# Patient Record
Sex: Female | Born: 1955 | Race: White | Hispanic: No | Marital: Married | Smoking: Never smoker
Health system: Southern US, Community
[De-identification: ages and names within clinical notes are randomized; demographics above are authoritative.]

## PROBLEM LIST (undated history)

## (undated) DIAGNOSIS — Z860101 Personal history of adenomatous and serrated colon polyps: Secondary | ICD-10-CM

## (undated) DIAGNOSIS — Z86010 Personal history of colonic polyps: Secondary | ICD-10-CM

## (undated) DIAGNOSIS — K219 Gastro-esophageal reflux disease without esophagitis: Secondary | ICD-10-CM

## (undated) DIAGNOSIS — R0789 Other chest pain: Secondary | ICD-10-CM

## (undated) DIAGNOSIS — F418 Other specified anxiety disorders: Secondary | ICD-10-CM

## (undated) DIAGNOSIS — E785 Hyperlipidemia, unspecified: Secondary | ICD-10-CM

## (undated) DIAGNOSIS — R002 Palpitations: Secondary | ICD-10-CM

## (undated) DIAGNOSIS — F410 Panic disorder [episodic paroxysmal anxiety] without agoraphobia: Secondary | ICD-10-CM

## (undated) HISTORY — DX: Panic disorder (episodic paroxysmal anxiety): F41.0

## (undated) HISTORY — PX: COLONOSCOPY W/ POLYPECTOMY: SHX1380

## (undated) HISTORY — DX: Other chest pain: R07.89

## (undated) HISTORY — DX: Palpitations: R00.2

## (undated) HISTORY — DX: Other specified anxiety disorders: F41.8

## (undated) HISTORY — DX: Personal history of adenomatous and serrated colon polyps: Z86.0101

## (undated) HISTORY — DX: Gastro-esophageal reflux disease without esophagitis: K21.9

## (undated) HISTORY — DX: Hyperlipidemia, unspecified: E78.5

## (undated) HISTORY — DX: Personal history of colonic polyps: Z86.010

## (undated) HISTORY — PX: CARDIOVASCULAR STRESS TEST: SHX262

---
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
## 1993-10-03 HISTORY — PX: ABDOMINAL HYSTERECTOMY: SHX81

## 1998-04-09 ENCOUNTER — Other Ambulatory Visit: Admission: RE | Admit: 1998-04-09 | Discharge: 1998-04-09 | Payer: Self-pay | Admitting: Urology

## 2000-10-04 ENCOUNTER — Other Ambulatory Visit: Admission: RE | Admit: 2000-10-04 | Discharge: 2000-10-04 | Payer: Self-pay | Admitting: Obstetrics and Gynecology

## 2001-04-16 ENCOUNTER — Ambulatory Visit (HOSPITAL_COMMUNITY): Admission: RE | Admit: 2001-04-16 | Discharge: 2001-04-16 | Payer: Self-pay | Admitting: Gastroenterology

## 2001-04-16 ENCOUNTER — Encounter (INDEPENDENT_AMBULATORY_CARE_PROVIDER_SITE_OTHER): Payer: Self-pay | Admitting: Specialist

## 2001-08-31 ENCOUNTER — Ambulatory Visit (HOSPITAL_COMMUNITY): Admission: RE | Admit: 2001-08-31 | Discharge: 2001-08-31 | Payer: Self-pay | Admitting: Orthopedic Surgery

## 2001-08-31 ENCOUNTER — Encounter: Payer: Self-pay | Admitting: Orthopedic Surgery

## 2001-10-24 ENCOUNTER — Other Ambulatory Visit: Admission: RE | Admit: 2001-10-24 | Discharge: 2001-10-24 | Payer: Self-pay | Admitting: Obstetrics and Gynecology

## 2003-01-16 ENCOUNTER — Other Ambulatory Visit: Admission: RE | Admit: 2003-01-16 | Discharge: 2003-01-16 | Payer: Self-pay | Admitting: Obstetrics and Gynecology

## 2004-02-02 ENCOUNTER — Encounter: Payer: Self-pay | Admitting: Family Medicine

## 2006-10-03 DIAGNOSIS — R002 Palpitations: Secondary | ICD-10-CM

## 2006-10-03 HISTORY — DX: Palpitations: R00.2

## 2007-05-29 ENCOUNTER — Encounter: Payer: Self-pay | Admitting: Family Medicine

## 2007-11-28 ENCOUNTER — Encounter: Payer: Self-pay | Admitting: Family Medicine

## 2007-12-06 ENCOUNTER — Encounter: Payer: Self-pay | Admitting: Family Medicine

## 2008-01-30 ENCOUNTER — Observation Stay (HOSPITAL_COMMUNITY): Admission: EM | Admit: 2008-01-30 | Discharge: 2008-01-30 | Payer: Self-pay | Admitting: Emergency Medicine

## 2008-01-30 ENCOUNTER — Encounter: Payer: Self-pay | Admitting: Family Medicine

## 2008-02-18 ENCOUNTER — Encounter: Admission: RE | Admit: 2008-02-18 | Discharge: 2008-02-18 | Payer: Self-pay | Admitting: Family Medicine

## 2008-02-18 ENCOUNTER — Encounter: Payer: Self-pay | Admitting: Family Medicine

## 2010-04-26 ENCOUNTER — Encounter: Payer: Self-pay | Admitting: Family Medicine

## 2010-05-20 ENCOUNTER — Encounter: Payer: Self-pay | Admitting: Family Medicine

## 2010-05-28 ENCOUNTER — Encounter: Payer: Self-pay | Admitting: Family Medicine

## 2010-09-29 ENCOUNTER — Ambulatory Visit
Admission: RE | Admit: 2010-09-29 | Discharge: 2010-09-29 | Payer: Self-pay | Source: Home / Self Care | Attending: Family Medicine | Admitting: Family Medicine

## 2010-09-29 ENCOUNTER — Encounter: Payer: Self-pay | Admitting: Family Medicine

## 2010-09-29 DIAGNOSIS — F329 Major depressive disorder, single episode, unspecified: Secondary | ICD-10-CM

## 2010-09-29 DIAGNOSIS — E785 Hyperlipidemia, unspecified: Secondary | ICD-10-CM | POA: Insufficient documentation

## 2010-09-29 DIAGNOSIS — F410 Panic disorder [episodic paroxysmal anxiety] without agoraphobia: Secondary | ICD-10-CM

## 2010-09-29 DIAGNOSIS — D126 Benign neoplasm of colon, unspecified: Secondary | ICD-10-CM | POA: Insufficient documentation

## 2010-09-29 DIAGNOSIS — R635 Abnormal weight gain: Secondary | ICD-10-CM | POA: Insufficient documentation

## 2010-09-29 HISTORY — DX: Panic disorder (episodic paroxysmal anxiety): F41.0

## 2010-10-11 ENCOUNTER — Encounter: Payer: Self-pay | Admitting: Family Medicine

## 2010-10-23 ENCOUNTER — Ambulatory Visit
Admission: RE | Admit: 2010-10-23 | Discharge: 2010-10-23 | Payer: Self-pay | Source: Home / Self Care | Attending: Internal Medicine | Admitting: Internal Medicine

## 2010-10-23 DIAGNOSIS — J309 Allergic rhinitis, unspecified: Secondary | ICD-10-CM | POA: Insufficient documentation

## 2010-10-23 DIAGNOSIS — J019 Acute sinusitis, unspecified: Secondary | ICD-10-CM | POA: Insufficient documentation

## 2010-11-04 NOTE — Assessment & Plan Note (Signed)
Summary: New pt - high cholestrol BCBS/dt   Vital Signs:  Patient profile:   55 year old female Menstrual status:  hysterectomy Height:      68.25 inches Weight:      164 pounds BMI:     24.84 O2 Sat:      97 % on Room air Temp:     97.4 degrees F oral Pulse rate:   60 / minute Pulse rhythm:   regular BP sitting:   110 / 74  (right arm) Cuff size:   regular  Vitals Entered By: Francee Piccolo CMA Duncan Dull) (September 29, 2010 8:38 AM)  O2 Flow:  Room air  History of Present Illness: 55 y/o WF here to establish care, transferred from [facility] in [place] b/c it became too busy, "always waited too long for appt". Her concern is her weight--exercises regulary and eats prudent diet and has had gain of 20+ lbs over last few years.  Had lab panel (TSH, CBC, CMET, and Lipids) 05/2010 which was all normal except LDL 167 (Tchol 254).  Her HDL was 71 and her Trig were 82.  Her MD advised her to do basic TLC's. She has maintained healthy exercise habits all her life: cardio almost daily=45 min exercise bike, lifts wts several days per week, walks 4 mi with dog 3-4 days per week. Dietary habits good: wt watchers in the past, pays attention to portion size and tries to avoid high fat/high carb foods, tries to eat lots of veggies/fruits, avoids fast food and high fructose corn syrup altogether.  Admits that having a sweet tooth is her weakness but seems to control this well.  Other PMH details: long hx of intermittent mild depression, for which lexapro 5mg  helps. Several years of panic attacks that occur sporadically, seemingly unprovoked, characterized by lightheaded feeling, warmth/flushing, rapid heartbeat, feeling of doom...Marland KitchenMarland KitchenMarland Kitchenhas had cardiac eval in the past for this and all was negative.  She has learned to control these rather well with breathing exercises. She underwent hysterectomy at about age 48 and remembers having perimenopausal syndrome for a few years in her early 40s---these have  essentially resolved now.  She gets regular pelvic exams and mammograms  through her GYN.  Currently she is finishing up a course of zithromax for a sinus infection---says sinus symptoms have now abated.  Preventive Screening-Counseling & Management  Alcohol-Tobacco     Alcohol drinks/day: 0     Smoking Status: never  Caffeine-Diet-Exercise     Does Patient Exercise: yes      Drug Use:  no.    Current Medications (verified): 1)  Zithromax Z-Pak 250 Mg Tabs (Azithromycin) .... As Directed 2)  Lexapro 5 Mg Tabs (Escitalopram Oxalate) .... Take 1 Tablet By Mouth Once A Day 3)  Gnc Women's Heart Pack .... Take 1 Pack By Mouth Daily  Allergies (verified): 1)  Amoxicillin 2)  Erythromycin  Past History:  Family History: Last updated: 09/29/2010 Father: CAD in 45s, ?Lung cancer, DM 2. Mother: age 85--Healthy! Older brother: MI in 64's, hyperlipidemia. Older sister: healthy. 2nd brother d. age 73--accidental.  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 Mother: age 56--Healthy! Older brother: MI in 32's, hyperlipidemia. Older sister: healthy. 2nd brother d. age 57--accidental.  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 Smoked Alcohol use-no Drug use-no Regular exercise-yes Occupation:  employed Smoking Status:  never Drug Use:  no Does Patient Exercise:  yes  Review of Systems  The patient denies  anorexia, fever, weight loss, weight gain, vision loss, decreased hearing, hoarseness, chest pain, syncope, dyspnea on exertion, peripheral edema, prolonged cough, headaches, hemoptysis, abdominal pain, melena, hematochezia, severe indigestion/heartburn, hematuria, incontinence, genital sores, muscle weakness, suspicious skin lesions, transient blindness, difficulty walking, depression, unusual weight change, abnormal bleeding, enlarged lymph nodes, angioedema, and breast masses.    Physical Exam  General:  VS: noted, all normal.  BMI 24 Gen: Alert, well appearing, oriented x 4.  Pleasant affect. HEENT: Scalp without lesions or hair loss.  Ears: EACs clear, normal epithelium.  TMs with good light reflex and landmarks bilaterally.  Eyes: no injection, icteris, swelling, or exudate.  EOMI, PERRLA. Nose: no drainage or turbinate edema/swelling.  No inection or focal lesion.  Mouth: lips without lesion/swelling.  Oral mucosa pink and moist.  Dentition intact and without obvious caries or gingival swelling.  Oropharynx without erythema, exudate, or swelling.  Neck: supple.  No lymphadenopathy, thyromegaly, or mass. Chest: symmetric expansion, with nonlabored respirations.  Clear and equal breath sounds in all lung fields.   CV: RRR, no m/r/g.  Peripheral pulses 2+/symmetric. ABD: soft, NT, ND, BS normal.  No hepatospenomegaly or mass.  No bruits. EXT: no clubbing, cyanosis, or edema.     Impression & Recommendations:  Problem # 1:  WEIGHT GAIN (ICD-783.1) Assessment New Likely secondary to metabolic changes associated with aging and postmenopausal state. Reassured pt and encouraged her to continue healthy exercise habits and diet.  Also, recommended nutritionist consult, so she will pursue this on her own and call if referral from me is needed.  Problem # 2:  HYPERLIPIDEMIA, WITH HIGH HDL (ICD-272.4) Assessment: New Nutritionist referral as noted above. Recheck fasting lipids and serum glucose  in the spring 2012.  Problem # 3:  DEPRESSION (ICD-311) Assessment: Unchanged Doing well on lexapro 5mg  once daily.  Her updated medication list for this problem includes:    Lexapro 5 Mg Tabs (Escitalopram oxalate) .Marland Kitchen... Take 1 tablet by mouth once a day  Problem # 4:  COLONIC POLYPS, ADENOMATOUS (ICD-211.3) Assessment: Unchanged She is followed by GI (Dr. Kinnie Scales?) appropriately and will continue this. Will obtain old records.  Complete Medication List: 1)  Zithromax Z-pak 250 Mg Tabs (Azithromycin) .... As directed 2)  Lexapro 5 Mg Tabs (Escitalopram oxalate) .... Take 1 tablet by mouth once a day 3)  Gnc Women's Heart Pack  .... Take 1 pack by mouth daily  Patient Instructions: 1)  Arrange f/u this spring at your convenience. 2)  Arrange fasting blood work to be done 1 wk prior to that visit (lipid panel, fasting glucose).   Orders Added: 1)  New Patient Level III [21308]

## 2010-11-04 NOTE — Procedures (Signed)
Summary: Colon/Jeffrey Medoff MD  Colon/Jeffrey Medoff MD   Imported By: Lester [place] 10/13/2010 09:39:50  _____________________________________________________________________  External Attachment:    Type:   Image     Comment:   External Document

## 2010-11-04 NOTE — Procedures (Signed)
Summary: Colon/Jeffrey Medoff MD  Colon/Jeffrey Medoff MD   Imported By: Lester [place] 10/13/2010 09:42:26  _____________________________________________________________________  External Attachment:    Type:   Image     Comment:   External Document

## 2010-11-04 NOTE — Miscellaneous (Signed)
  Clinical Lists Changes  Observations: Added new observation of FAMILY HX: Father: CAD in 75s, ?Lung cancer, DM 2, depression. Mother: age 55--Healthy! Older brother: MI in 66's, hyperlipidemia. Older sister: healthy. 2nd brother d. age 9--accidental. (10/11/2010 15:08) Added new observation of PAST MED HX: Depression Panic disorder Hyperlipidemia (LDL 167; goal<160) Adenomatous colon polyps 2002, TCS nl 2005 and 2009 (except oozing internal hemorrhoid tx'd with sclerotherapy 2009)--Dr. Medoff Palpitations-Holter neg 2008 Cardiolite NEG 2004 (10/11/2010 15:08) Added new observation of PRIMARY MD: Nicoletta Ba, MD (10/11/2010 15:08) Added new observation of MAMMOGRAM: normal (04/26/2010 15:13) Added new observation of COLONOSCOPY: normal (12/06/2007 15:13) Added new observation of TD BOOSTER: Tdap (01/24/2006 15:13)       Family History: Father: CAD in 62s, ?Lung cancer, DM 2, depression. Mother: age 55--Healthy! Older brother: MI in 20's, hyperlipidemia. Older sister: healthy. 2nd brother d. age 9--accidental.    Past History:  Past Medical History: Depression Panic disorder Hyperlipidemia (LDL 167; goal<160) Adenomatous colon polyps 2002, TCS nl 2005 and 2009 (except oozing internal hemorrhoid tx'd with sclerotherapy 2009)--Dr. Medoff Palpitations-Holter neg 2008 Cardiolite NEG 2004   Preventive Care Screening  Mammogram:    Date:  04/26/2010    Results:  normal  Colonoscopy:    Date:  12/06/2007    Results:  normal  Last Tetanus Booster:    Date:  01/24/2006    Results:  Tdap

## 2010-11-04 NOTE — Letter (Signed)
Summary: [facility]  [facility]   Imported By: Lester [place] 10/13/2010 09:41:07  _____________________________________________________________________  External Attachment:    Type:   Image     Comment:   External Document

## 2010-11-04 NOTE — Assessment & Plan Note (Signed)
Summary: SORE THROAT/PN   Vital Signs:  Patient profile:   55 year old female Menstrual status:  hysterectomy Weight:      169 pounds BMI:     25.60 Temp:     98.5 degrees F Pulse rate:   52 / minute BP sitting:   108 / 70  Vitals Entered By: Lamar Sprinkles, CMA (October 23, 2010 10:19 AM) CC: sore throat/SD   History of Present Illness: Tanya Daniels comes in today  for "  bad raw sore throat "  to [facility]   onset yestrerday  and  going out of town for a  week.      gets them recurrent .    and couple times a year  consider / sinsutisi  hving pnd . no fever  hurts to swallow.   No cough congestion but nasal drip some.    took mucinex.   Saw Dr Foy Guadalajara her previous pcp in the past   and rx with z pack  and flonase   and another nose spray.   / sinus infection.  "if not treated   gets lung issues.   going tpo chest : no strep exposures .     has allergic rhnitis ususally fall   ,, also mold and dust.   no obv recent flares. used to see allergist Dr Nira Retort . No tobacco or ets.    Preventive Screening-Counseling & Management  Alcohol-Tobacco     Alcohol drinks/day: 0     Smoking Status: never  Current Medications (verified): 1)  Lexapro 10 Mg Tabs (Escitalopram Oxalate) .Marland Kitchen.. 1 Once Daily 2)  Gnc Women's Heart Pack .... Take 1 Pack By Mouth Daily  Allergies (verified): 1)  Amoxicillin 2)  Erythromycin  Past History:  Past Medical History: Depression Panic disorder Hyperlipidemia (LDL 167; goal<160) Adenomatous colon polyps 2002, TCS nl 2005 and 2009 (except oozing internal hemorrhoid tx'd with sclerotherapy 2009)--Dr. Medoff Palpitations-Holter neg 2008 Cardiolite NEG 2004 Allergic rhinitis saw Dr Corinda Gubler in the past.  Family History: Father: CAD in 62s, ?Lung cancer, DM 2, depression. Mother: age 96--Healthy! Older brother: MI in 16's, hyperlipidemia. Older sister: healthy. 2nd brother d. age 78--accidental.      Social History: Reviewed history  from 09/29/2010 and no changes required. 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         see hpi  Physical Exam  General:  Well-developed,well-nourished,in no acute distress; alert,appropriate and cooperative throughout examination non toxic but doesnt feel well.  Head:  normocephalic and atraumatic.   Eyes:  vision grossly intact, pupils equal, and pupils round.   Ears:  R ear normal, L ear normal, and no external deformities.   Nose:  no external deformity and no external erythema.  congested   sightly tender left max sinus Mouth:  very red  post OP  crescentic area no lesions no ulcers  Neck:  tedner ac nodes neg pc nodes  Lungs:  normal respiratory effort, no intercostal retractions, and no accessory muscle use.   Heart:  normal rate, regular rhythm, and no murmur.   Pulses:  nl cap refill  Neurologic:  non focal Skin:  turgor normal and color normal.   Cervical Nodes:  no posterior cervical adenopathy.  tender ac area  Psych:  Oriented X3, normally interactive, and not anxious appearing.     Impression & Recommendations:  Problem # 1:  SINUSITIS - ACUTE-NOS (ICD-461.9) poss resolve on own but  leaving town   and patient concerned about this    reviewed Treatment options discussed.      hx of gi se of some other meds .  Her updated medication list for this problem includes:    Azithromycin 250 Mg Tabs (Azithromycin) .Marland Kitchen... Take 2 by mouth day 1 then 1 by mouth once daily for 4 more days    Fluticasone Propionate 50 Mcg/act Susp (Fluticasone propionate) .Marland Kitchen... 2 spray s each nostril q d  Problem # 2:  PHARYNGITIS-ACUTE (ICD-462) prob related  to pnd of above  Her updated medication list for  this problem includes:    Azithromycin 250 Mg Tabs (Azithromycin) .Marland Kitchen... Take 2 by mouth day 1 then 1 by mouth once daily for 4 more days  Problem # 3:  ALLERGIC RHINITIS (ICD-477.9) fall and  molds and dust .  reviewed   poss aggravators  if recurrent issue Her updated medication list for this problem includes:    Fluticasone Propionate 50 Mcg/act Susp (Fluticasone propionate) .Marland Kitchen... 2 spray s each nostril q d  Complete Medication List: 1)  Lexapro 10 Mg Tabs (Escitalopram oxalate) .Marland Kitchen.. 1 once daily 2)  Gnc Women's Heart Pack  .... Take 1 pack by mouth daily 3)  Azithromycin 250 Mg Tabs (Azithromycin) .... Take 2 by mouth day 1 then 1 by mouth once daily for 4 more days 4)  Fluticasone Propionate 50 Mcg/act Susp (Fluticasone propionate) .... 2 spray s each nostril q d  Patient Instructions: 1)  begin flonase   2)  can begin antibioitc but many sinus infections resolve in 5- 10 days without  antibioitcs.   3)  azithro may not always be the best choice for sinsutis . 4)  follow up with dr Milinda Cave if recurrent problems.  5)  Acute sinusitis symptoms for less than 10 days are not helped by antibiotics. Use warm moist compresses, and over the counter decongestants( only as directed). Call if no improvement in 5-7 days, sooner if increasing pain, fever, or new symptoms.  Prescriptions: FLUTICASONE PROPIONATE 50 MCG/ACT SUSP (FLUTICASONE PROPIONATE) 2 spray s each nostril q d  #1 x 3   Entered and Authorized by:   Madelin Headings MD   Signed by:   Madelin Headings MD on 10/23/2010   Method used:   Electronically to        [id number] (retail)       [address] [place], [place]  [address]       Ph: [phone] or [phone]       Fax: [phone]   RxID:   [id number] AZITHROMYCIN 250 MG TABS (AZITHROMYCIN) take 2 by mouth day 1 then 1 by mouth once daily for 4 more days  #6 x 0   Entered and Authorized by:   Madelin Headings MD   Signed by:   Madelin Headings MD on  10/23/2010   Method used:   Electronically to        [facility] [facility] [phone]* (retail)       [address]       [place], [place]  [address]  Ph: [phone] or [phone]       Fax: [phone]   RxID:   [id number]    Orders Added: 1)  Est. Patient Level IV [44010]

## 2010-11-04 NOTE — Miscellaneous (Signed)
  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 Kinnie Scales (09/29/2010 10:28) Added new observation of GYNECO MD: Dr. Senaida Ores (09/29/2010 10:28) Added new observation of PRIMARY MD: Nicoletta Ba, MD (09/29/2010 10:28)      Care Coordination Cardiologist: [facility] cardiology Gastroenterologist: Dr. Kinnie Scales Gynecologist: Dr. 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 Never Smoked Alcohol use-no Drug use-no Regular exercise-yes

## 2010-12-09 ENCOUNTER — Encounter: Payer: Self-pay | Admitting: Family Medicine

## 2011-02-15 NOTE — Consult Note (Signed)
Tanya Daniels, SEABORN NO.:  [id number]   MEDICAL RECORD NO.:  [id number]          PATIENT TYPE:  INP   LOCATION:  4729                         FACILITY:  [facility]   PHYSICIAN:  Francisca December, M.D.  DATE OF BIRTH:  26-Jun-1956   DATE OF CONSULTATION:  01/30/2008  DATE OF DISCHARGE:                                 CONSULTATION   CHIEF COMPLAINT:  Chest pain.   Ms. Tanya Daniels is a 55 year old female with no known history of coronary  artery disease who states she had a panic attack yesterday afternoon.  She complained of chest wall burning and tingling and actually called  EMS initially yesterday afternoon.  No transport.  Then later yesterday  evening, the same symptoms occurred but worsened.  She had no shortness  of breath, palpitation, dizziness or syncope.  Of note, she does  exercise about a hour daily.   She states she had a Cardiolite about five years ago under the care of  Dr. Corliss Marcus.  She reports this as normal.  She had a 2-D echo in  the office as well and has had a heart monitor before when she had felt  palpitations during panic attacks but she states that this test never  showed anything.  I am trying to retrieve these records now.   SOCIAL HISTORY:  No tobacco, rare alcohol, no illicit drug use.   FAMILY HISTORY:  Dad had a heart attack around the age of 55.  Mom alive  at 34.   ALLERGIES:  No known drug allergies.   MEDICATIONS:  1. Enteric coated aspirin 325 mg a day.  2. Wellbutrin 150 mg a day.  3. Lexapro 10 mg a day.  4. Protonix 40 mg a day.   PAST MEDICAL HISTORY:  1. Depression.  2. Anxiety.  3. Panic attacks.  4. Status post partial hysterectomy.   PHYSICAL EXAMINATION:  VITAL SIGNS:  Temperature 98.2, pulse 67,  respirations 24, blood pressure 102/63, O2 saturation 97% on room air.  HEENT:  Grossly normal.  No carotid or subclavian bruits.  No JVD or  thyromegaly.  Sclerae clear.  Conjunctivae normal.  Nares without  drainage.  CHEST:  Clear to auscultation bilaterally.  No wheezing or rhonchi.  HEART:  Regular rate and rhythm.  No gross murmur, rub or ectopy.  ABDOMEN:  Good bowel sounds, nontender, nondistended, no mass, no  bruits.  LOWER EXTREMITIES:  No peripheral edema.  Palpable lower extremity  pulses.  SKIN:  Warm and dry.  NEURO:  Cranial nerves II-XII grossly intact.  Normal mood and affect.   REVIEW OF SYSTEMS:  As above, otherwise negative.   LABORATORY STUDIES:  Point of care markers and cardiac isoenzymes  essentially normal.  BMP 34.  Total cholesterol 213, triglycerides 25,  LDL 152, HDL 56.  Lipase 30.  D-dimer less than 0.22.  Sodium 140,  potassium 3.8, BUN 13, creatinine 0.97.  LFTs normal.   EKG shows normal sinus rhythm, no acute ST-T wave changes.   Chest x-ray no active disease.   ASSESSMENT/PLAN:  1. Chest pain, resolved.  2.  Anxiety/depression.  3. Hyperlipidemia.   RECOMMENDATIONS:  Diet with exercise and may ultimately need to go ahead  and start a Statin.   Plan to have the patient come in for stress Cardiolite on Monday, Feb 04, 2008, at 8:30 a.m.  We will send the patient home with some  sublingual nitroglycerin and Xanax 0.25 mg one p.o. q.8h. p.r.n., #10  with no refills.  The patient is to call Dr. Amil Amen for any further  problems.  The patient may be discharged from a cardiovascular  standpoint.      Guy Franco, P.A.      Francisca December, M.D.  Electronically Signed    LB/MEDQ  D:  01/30/2008  T:  01/30/2008  Job:  [id number]   cc:   Molly Maduro L. Foy Guadalajara, M.D.

## 2011-02-15 NOTE — H&P (Signed)
Tanya Daniels, Tanya Daniels NO.:  [id number]   MEDICAL RECORD NO.:  [id number]          PATIENT TYPE:  EMS   LOCATION:  [facility]JO                         FACILITY:  [facility]   PHYSICIAN:  Michiel Cowboy, MDDATE OF BIRTH:  20-Sep-1956   DATE OF ADMISSION:  01/30/2008  DATE OF DISCHARGE:                              HISTORY & PHYSICAL   PRIMARY CARE PHYSICIAN:  Robert L. Foy Guadalajara, M.D.   CHIEF COMPLAINT:  Chest pain.   HISTORY OF PRESENT ILLNESS:  The patient is a 55 year old female with  history of hyperlipidemia, though not on any medications.  There is also  history of anxiety.  Presents with atypical chest pain.  She was at her  baseline of health until yesterday when she had an episode of  palpitations for which EMS was called, and they warned this was thought  to be secondary to an anxiety attack.  Today, this morning, she woke up  with substernal chest pain and radiation to both arms and to the back,  burning-like in one spot, coming in waves lasting 30 seconds at a time  and then going away.   Currently, during this discussion, she only states she has some warm  sensation in her chest.  She never had this kind of similar episodes  before.  She does not smoke or drink.   REVIEW OF SYSTEMS:  Unremarkable.  Negative for shortness of breath,  fevers, chills, nausea, vomiting.  Positive for diaphoresis.   FAMILY HISTORY:  Significant for grandfather with heart attack between  50-60s.   SOCIAL HISTORY:  Denies alcohol or drug abuse.  Does not smoke.  Drinks  only occasionally.   PAST MEDICAL HISTORY:  Significant for anxiety, question hyperlipidemia.   MEDICATIONS:  1. Lexapro 20 mg p.o. daily, today is the first dose.  2. Albuterol 150 mg p.o. daily.   ALLERGIES:  No known drug allergies.   PHYSICAL EXAMINATION:  VITAL SIGNS:  Temperature 97.3, blood pressure  109/69, pulse 72, respirations 20, saturating 100% on room air.  GENERAL:  The patient appears to be  female in no acute distress.  HEENT:  Normocephalic.  Mucous membranes moist.  No lymphadenopathy  noted.  LUNGS:  Clear to auscultation bilaterally.  HEART:  Regular rate and rhythm.  No murmurs, rubs or gallops.  ABDOMEN:  Soft, nontender, nondistended.  NEUROLOGICAL:  Intact.  EXTREMITIES:  Lower extremities without edema.   LABORATORY DATA:  White blood cell count 6.0, hemoglobin 12.0, platelets  217.  INR 0.9, Sodium 140, potassium 3.8, creatinine 0.97.  LFTs are  within normal limits.  Lipase 30.  CK MB 1.4.  Troponin was initially  elevated at 0.06 with repeat less than 0.05.  EKG showing no evidence of  ischemia or infarction.  D. dimer negative.  Chest x-ray not obtained.   ASSESSMENT:  1. This is a 55 year old female with very difficult chest pain.      Etiology not quite sure.  Troponin is slightly elevated.  The      patient initially was started on heparin and nitroglycerin drip,      but  currently is slightly hypertensive and chest pain is really of      just a warm sensation.  Will stop both for now since today the ED      physicians have been having a few falsely positive elevated      troponins with I markers.  Will repeat cardiac enzymes and cycle      q.8.  Will risk stratify the fasting lipid panel, morning EKG.      Will obtain chest x-ray.  D. dimer is negative.  Other      differentials include GI etiology such as esophageal spasm versus      heartburn.  Will do Protonix 40 mg p.o. daily and Carafate.  Would      recommend discussing her with Cardiology to see if they want to go      ahead and get a stress test done while inpatient versus as an      outpatient.  This is not a typical chest pain for aortic      dissection.  Will obtain chest x-ray and measure blood pressure in      both arms, again, if the pain is radiating to the back.  The      patient appears to be very stable.  Will hold off on ordering CT of      her chest for now.  2. Palpitations.  Put  on telemetry.  3. History of anxiety.  Will continue home medications.  4. Prophylaxis SCDs and Protonix.      Michiel Cowboy, MD  Electronically Signed     AVD/MEDQ  D:  01/30/2008  T:  01/30/2008  Job:  [id number]   cc:   Molly Maduro L. Foy Guadalajara, M.D.

## 2011-02-18 NOTE — Op Note (Signed)
[facility]  Patient:    Tanya Daniels, Tanya Daniels Visit Number: [id number] MRN: [id number]          Service Type: DSU Location: DAY Attending Physician:  Marlowe Kays Page Dictated by:   Illene Labrador. Aplington, M.D. Proc. Date: 08/31/01 Admit Date:  08/31/2001                             Operative Report  PREOPERATIVE DIAGNOSIS:  Painful bunion, right foot.  POSTOPERATIVE DIAGNOSIS:  Painful bunion, right foot.  OPERATION:  Simple bunionectomy, right foot.  SURGEON:  Illene Labrador. Aplington, M.D.  ASSISTANT:  Nurse.  ANESTHESIA:  General.  PATHOLOGY AND JUSTIFICATION FOR PROCEDURE:  She had a prominent tender bunion with problems with shoe wearing, good-looking MP joint on plain x-rays with standing film demonstrating an 11 degree first second metatarsal angle, so I felt a simple bunionectomy would take care of the problem.  At surgery, she did have some early erosive changes on the first metatarsal head.  DESCRIPTION OF PROCEDURE:  Satisfactory general anesthesia, pneumatic tourniquet, foot and ankle prepped with DuraPrep and draped in a sterile field.  Dorsomedial incision from the distal first metatarsal down over the proximal phalanx of the great toe.  Incision was carried down through the capsule in line with the skin incision.  She had extensive adhesions between the capsule and the first metatarsal head.  After identifying the demarcation between bunion and apparent first metatarsal head, I made a countercut at the base of the bunion with a half-inch curved osteotome and then made a retrograde cut, removing most of the bunion, and I then trimmed up the remainder with small rongeur until I felt that we had removed all bunion bone and had nice cosmetic result.  The wound was then irrigated with sterile saline, and the wound toe blocked with 0.5% plain Marcaine with the toe in a corrected position.  I then closed the capsule with oblique  sutures, stabilizing the great toe in a corrected position.  Subcutaneous tissue and skin were then closed as a unit with interrupted 4-0 nylon mattress sutures. Betadine and Adaptic dry sterile dressing were applied.  Tourniquet was released.  She tolerated the procedure well and was taken to the recovery room in satisfactory condition with no known complications. Dictated by:   Illene Labrador. Aplington, M.D. Attending Physician:  Joaquin Courts DD:  08/31/01 TD:  08/31/01 Job: [id number]

## 2011-03-18 ENCOUNTER — Telehealth: Payer: Self-pay | Admitting: Family Medicine

## 2011-03-18 NOTE — Telephone Encounter (Signed)
Pt notified of recommendations below.  Pt wrote instructions down and was able to recite.  She is agreeable with plan.

## 2011-03-18 NOTE — Telephone Encounter (Signed)
Patient is welcome to be seen but there are some things she can try to manage the pain. For tension and/or Migraine HA I recommend Advil/Ibuprofen 400-600 mg every 6 hours with food, can alternate with Tylenol EX/Acetminophen 500 mg 2 tabs po every 6 hours to manage pain. Since she is having neck trouble too also recommend moist heat to neck for 15 minutes twice a day and as needed. In general to manage HA need 8 hours sleep, regular exercise, small frequent meals with lean proteins and complex carbs, cannot skip meals or eat too many carbs at once or this can trigger HA. Also need 64+oz of fluids daily. If no improvement call for further evaluation. If symptoms worsen over the weekend or neurologic concerns such as visual changes/numbness/weakness etc occur seek immediate medical care

## 2011-03-18 NOTE — Telephone Encounter (Signed)
I spoke with pt who states she is having a sharp, stabbing pain behind her right ear.  The pain began on 6/13.  The patient does admit that she has had these twinges, but not the stabbing pain in the last two years.  The patient has not had any lifestyle changes-sleep pattern changes or diet changes.  The pt is under increased stress at this time due to work.  The pain is interrupting her sleep.  She states the pain feels like an electrical shock at times.  Pt denies any photo/phonophobia, vision/gait disturbance.  Pt has taken five 200 mg advil in the last 24 hours and has applied ice pack to neck.  Pt has history of neck pain did go to chiropractor on 03/17/11.  Please advise.

## 2011-04-01 ENCOUNTER — Other Ambulatory Visit: Payer: Self-pay | Admitting: Family Medicine

## 2011-04-01 MED ORDER — ESCITALOPRAM OXALATE 5 MG PO TABS: 5.0000 mg | ORAL_TABLET | Freq: Every day | ORAL | Status: DC

## 2011-04-01 NOTE — Telephone Encounter (Signed)
Patient is wanting a refill on her lexipro. Patient is asking for a prescription for a year and for her to receive them 90 days at a time.

## 2011-04-01 NOTE — Telephone Encounter (Signed)
I have attempted to contact this patient by phone with the following results: message left to return my call with female at home number.

## 2011-04-01 NOTE — Telephone Encounter (Signed)
I'll do the 90d supply, RF x 4. I want her to come in for fasting lipid profile sometime this summer or fall.  She has a history of high cholesterol that she needs rechecked.  Doesn't need office visit for this unless the wants to.  Next office visit needs to be sometime around 11/2011.  Thanks--PM

## 2011-04-04 MED ORDER — ESCITALOPRAM OXALATE 5 MG PO TABS: 5.0000 mg | ORAL_TABLET | Freq: Every day | ORAL | Status: DC

## 2011-04-04 NOTE — Telephone Encounter (Signed)
Pt notified RX ready to pick up-she will have her son, [name], pick up.  Pt will need a 10 day RX to last until mail order arrives.  Advised about lipid panel and she is agreeable.  Pt will come in the fall for that as she wants more time to adjust diet and exercise and to lose more weight.

## 2011-06-28 LAB — CBC
Hemoglobin: 12
MCHC: 33.8
RBC: 4.18
WBC: 6

## 2011-06-28 LAB — HEMOGLOBIN A1C: Hgb A1c MFr Bld: 5

## 2011-06-28 LAB — COMPREHENSIVE METABOLIC PANEL
ALT: 17
Alkaline Phosphatase: 91
CO2: 25
Calcium: 9.1
Chloride: 106
GFR calc non Af Amer: >60
Glucose, Bld: 103 — ABNORMAL HIGH
Sodium: 140
Total Bilirubin: 0.4

## 2011-06-28 LAB — CK TOTAL AND CKMB (NOT AT ARMC)
CK, MB: 2.8
Relative Index: 2.1

## 2011-06-28 LAB — DIFFERENTIAL
Basophils Absolute: 0
Basophils Relative: 1
Eosinophils Absolute: 0.2
Eosinophils Relative: 3
Lymphs Abs: 1.8
Neutrophils Relative %: 58

## 2011-06-28 LAB — POCT CARDIAC MARKERS
CKMB, poc: 1.3
CKMB, poc: 1.4
Myoglobin, poc: 61.5
Operator id: 272551

## 2011-06-28 LAB — TROPONIN I: Troponin I: 0.01

## 2011-06-28 LAB — D-DIMER, QUANTITATIVE: D-Dimer, Quant: <0.22

## 2011-06-28 LAB — LIPID PANEL
Triglycerides: 25
VLDL: 5

## 2011-06-28 LAB — LIPASE, BLOOD: Lipase: 30

## 2011-06-28 LAB — PROTIME-INR: INR: 0.9

## 2011-06-28 LAB — CARDIAC PANEL(CRET KIN+CKTOT+MB+TROPI): CK, MB: 2.1

## 2011-06-28 LAB — B-NATRIURETIC PEPTIDE (CONVERTED LAB): Pro B Natriuretic peptide (BNP): 34

## 2011-08-22 ENCOUNTER — Encounter: Payer: Self-pay | Admitting: Family Medicine

## 2011-08-22 ENCOUNTER — Ambulatory Visit (INDEPENDENT_AMBULATORY_CARE_PROVIDER_SITE_OTHER): Payer: Self-pay | Admitting: Family Medicine

## 2011-08-22 DIAGNOSIS — E785 Hyperlipidemia, unspecified: Secondary | ICD-10-CM

## 2011-08-22 DIAGNOSIS — Z23 Encounter for immunization: Secondary | ICD-10-CM

## 2011-08-22 LAB — COMPREHENSIVE METABOLIC PANEL
Albumin: 4.1 g/dL (ref 3.5–5.2)
CO2: 26 mEq/L (ref 19–32)
Chloride: 107 mEq/L (ref 96–112)
GFR: 72.81 mL/min (ref >60.00)
Glucose, Bld: 97 mg/dL (ref 70–99)
Potassium: 4.3 mEq/L (ref 3.5–5.1)
Sodium: 141 mEq/L (ref 135–145)
Total Protein: 7.1 g/dL (ref 6.0–8.3)

## 2011-08-22 LAB — CBC WITH DIFFERENTIAL/PLATELET
Basophils Absolute: 0 10*3/uL (ref 0.0–0.1)
Eosinophils Absolute: 0.1 10*3/uL (ref 0.0–0.7)
HCT: 36.1 % (ref 36.0–46.0)
Hemoglobin: 12.3 g/dL (ref 12.0–15.0)
Lymphocytes Relative: 27 % (ref 12.0–46.0)
Lymphs Abs: 1.3 10*3/uL (ref 0.7–4.0)
MCHC: 34.1 g/dL (ref 30.0–36.0)
Neutro Abs: 3 10*3/uL (ref 1.4–7.7)
RDW: 13 % (ref 11.5–14.6)

## 2011-08-22 LAB — LDL CHOLESTEROL, DIRECT: Direct LDL: 161 mg/dL

## 2011-08-22 MED ORDER — ZOSTER VACCINE LIVE 19400 UNT/0.65ML ~~LOC~~ SOLR: 0.6500 mL | Freq: Once | SUBCUTANEOUS | Status: AC

## 2011-08-22 NOTE — Progress Notes (Signed)
OFFICE NOTE  08/22/2011  CC:  Chief Complaint  Patient presents with  . Depression     HPI: Patient is a 55 y.o. Caucasian female who is here for f/u panic/anxiety, lexapro use. Feels like things are going well, no side effects, wants to continue same dosing. She works out most days of the week, runs and does [employer], working a lot on diet/fitness. She asks for rx for zostavax today.  Pertinent PMH:  Anxiety/panic disorder/depression Adenomatous colon polyps Wt gain Family History  Problem Relation Age of Onset  . Coronary artery disease Father 10  . Cancer Father     Possible lung cancer  . Diabetes Father     type 2  . Depression Father   . Heart attack Brother 60  . Hyperlipidemia Brother    Past medical, surgical, and social history reviewed and there are no changes since their last office visit.  Pertinent Meds:  PE: Blood pressure 96/66, pulse 57, height 5' 8.25" (1.734 m), weight 170 lb (77.111 kg). Gen: Alert, well appearing.  Patient is oriented to person, place, time, and situation. CV: RRR, no m/r/g.   LUNGS: CTA bilat, nonlabored resps, good aeration in all lung fields.   IMPRESSION AND PLAN: Panic disorder, anxiety, depression: all stable.  Doing well on lexapro 5mg  qd and we'll continue this. Will do some screening labs today; pt has history of borderline cholesterol + FH hyperlip+CAD. CMET, CBC, FLP, TSH today. Her wt is stable at 169-170 lbs. Encouraged her and complemented her for doing such a great job with lifestyle modification.  FOLLOW UP: 1 yr.

## 2011-08-29 ENCOUNTER — Other Ambulatory Visit: Payer: BC Managed Care – PPO

## 2011-09-09 ENCOUNTER — Ambulatory Visit (INDEPENDENT_AMBULATORY_CARE_PROVIDER_SITE_OTHER): Payer: BC Managed Care – PPO | Admitting: Family Medicine

## 2011-09-09 ENCOUNTER — Encounter: Payer: Self-pay | Admitting: Family Medicine

## 2011-09-09 VITALS — BP 102/71 | HR 64 | Temp 98.2°F | Ht 68.25 in | Wt 176.0 lb

## 2011-09-09 DIAGNOSIS — R059 Cough, unspecified: Secondary | ICD-10-CM

## 2011-09-09 DIAGNOSIS — J3489 Other specified disorders of nose and nasal sinuses: Secondary | ICD-10-CM

## 2011-09-09 DIAGNOSIS — J019 Acute sinusitis, unspecified: Secondary | ICD-10-CM

## 2011-09-09 DIAGNOSIS — R05 Cough: Secondary | ICD-10-CM

## 2011-09-09 MED ORDER — CEPHALEXIN 500 MG PO CAPS: ORAL_CAPSULE | ORAL | Status: DC

## 2011-09-09 NOTE — Assessment & Plan Note (Signed)
She is tough to call--viral URI vs acute bacterial sinusitis. Change symptomatic care to nonsedating antihist + decong tab, continue afrin q12h prn (appropriate over-use precautions given today),  And use nasal saline spray liberally. I gave rx for keflex 500mg  tid x 10d to fill if not feeling significant improvement in the next 2d.

## 2011-09-09 NOTE — Progress Notes (Signed)
OFFICE NOTE  09/09/2011  CC:  Chief Complaint  Patient presents with  . URI    began Wednesday, pt is flying on Sunday and doesn't want to go into sinuses     HPI:   Patient is a 55 y.o. Caucasian female who is here for respiratory complaints. Pt presents complaining of respiratory symptoms for 4  days.  Mostly nasal congestion/runny nose, sneezing, and mild PND cough.  Worst symptoms seems to be the head fullness, nose congestion, HA, facial pressure.  Lately the symptoms seem to be worsening--subjective f/c last night. No fevers, no wheezing, and no SOB.  No pain in face or teeth.  ST present at first, gone now.  Symptoms made worse by night time, cool air, airplane flights.  Symptoms improved by mucinex D, afrin nasal spray, and advil minimally. Smoker? no Recent sick contact? no Muscle or joint aches? no She did get flu vaccine this season.  ROS: no n/v/d or abdominal pain.  No rash.  No neck stiffness.   +Mild fatigue.  +Mild appetite loss.   Pertinent PMH:  Anxiety d/o Hyperlipidemia-awaiting results of advanced lipid testing Allergic rhinitis  MEDS;   Outpatient Prescriptions Prior to Visit  Medication Sig Dispense Refill  . escitalopram (LEXAPRO) 5 MG tablet Take 1 tablet (5 mg total) by mouth daily.  10 tablet  0  . Specialty Vitamins Products (HEART HEALTH PACK PO) Take 1 packet by mouth daily.          PE: Blood pressure 102/71, pulse 64, temperature 98.2 F (36.8 C), temperature source Temporal, height 5' 8.25" (1.734 m), weight 176 lb (79.833 kg), SpO2 99.00%. VS: noted--normal. Gen: alert, NAD, NONTOXIC APPEARING. HEENT: eyes without injection, drainage, or swelling.  Ears: EACs clear, TMs with normal light reflex and landmarks.  Nose: Clear rhinorrhea, with some dried, crusty exudate adherent to mildly injected mucosa.  No purulent d/c.  No paranasal sinus TTP.  No facial swelling.  Throat and mouth without focal lesion.  No pharyngial swelling, erythema, or  exudate.   Neck: supple, no LAD.   LUNGS: CTA bilat, nonlabored resps.   CV: RRR, no m/r/g. EXT: no c/c/e SKIN: no rash  IMPRESSION AND PLAN:  SINUSITIS - ACUTE-NOS She is tough to call--viral URI vs acute bacterial sinusitis. Change symptomatic care to nonsedating antihist + decong tab, continue afrin q12h prn (appropriate over-use precautions given today),  And use nasal saline spray liberally. I gave rx for keflex 500mg  tid x 10d to fill if not feeling significant improvement in the next 2d.      FOLLOW UP:  Return if symptoms worsen or fail to improve.

## 2011-09-09 NOTE — Patient Instructions (Signed)
D/c mucinex. Start either generic allegra D OR zyrtec D OR claritin D as directed on box. Saline nasal spray several times per day.

## 2011-09-13 ENCOUNTER — Telehealth: Payer: Self-pay | Admitting: Family Medicine

## 2011-09-13 ENCOUNTER — Other Ambulatory Visit: Payer: Self-pay | Admitting: Family Medicine

## 2011-09-13 MED ORDER — ATORVASTATIN CALCIUM 10 MG PO TABS: 10.0000 mg | ORAL_TABLET | Freq: Every day | ORAL | Status: DC

## 2011-09-13 NOTE — Telephone Encounter (Signed)
Pls call pt and tell her that her advanced lipid testing showed she has lipid particles that put her at increased risk for cardiovascular disease (like heart attack and stroke). Tell her I recommend she start atorvastatin 10mg  (generic lipitor) AND aspirin 81mg  once daily. We'll recheck her "normal" cholesterol panel in 3 months (lab visit, dx 272.4).

## 2011-09-14 NOTE — Telephone Encounter (Signed)
Advised pt of results and risks, and of need to begin cholesterol lowering medication.  Pt states 2 things regarding recommendation: 1-brother had reaction to cholesterol med and she would like to verify name prior to starting, and 2-she can get a free cholesterol med through [employer].  She will call back with both drug names. RC from pt.  She states Lipitor is drug her brother had problems with.  Leschol is [employer] drug she can get for free through local and mail order pharmacy. Please advise if OK.

## 2011-09-14 NOTE — Telephone Encounter (Signed)
I'm ok with this, but is it plain Lescol (have to take it twice a day) or Lescol XL that she can get free, etc?  Does she want me to print rx or do eRx for both local and mail order???-

## 2011-09-15 ENCOUNTER — Other Ambulatory Visit: Payer: Self-pay | Admitting: Family Medicine

## 2011-09-15 MED ORDER — FLUVASTATIN SODIUM ER 80 MG PO TB24: 80.0000 mg | ORAL_TABLET | Freq: Every day | ORAL | Status: DC

## 2011-09-15 NOTE — Telephone Encounter (Signed)
Written Rx forwarded to Provider for signature.  Left message at home # to return my call.

## 2011-09-15 NOTE — Telephone Encounter (Signed)
RC from pt.  Either strength is covered.  She would prefer XL.  She would like 30 day RX sent to local pharmacy and written 90 day RX that she will pick up and mail to her mail order pharmacy.

## 2011-09-15 NOTE — Telephone Encounter (Signed)
Both Rx's done as requested--PM

## 2011-09-15 NOTE — Telephone Encounter (Signed)
I have attempted to contact this patient by phone with the following results: left message to return my call on answering machine (mobile).  

## 2011-09-19 ENCOUNTER — Encounter: Payer: Self-pay | Admitting: Family Medicine

## 2011-11-11 ENCOUNTER — Other Ambulatory Visit: Payer: Self-pay | Admitting: *Deleted

## 2011-11-11 MED ORDER — FLUVASTATIN SODIUM ER 80 MG PO TB24: 80.0000 mg | ORAL_TABLET | Freq: Every day | ORAL | Status: DC

## 2011-11-11 NOTE — Telephone Encounter (Signed)
Last follow up 08/21/12.  Follow up in one year.  RX sent.

## 2011-11-26 ENCOUNTER — Ambulatory Visit (INDEPENDENT_AMBULATORY_CARE_PROVIDER_SITE_OTHER): Payer: BC Managed Care – PPO | Admitting: Internal Medicine

## 2011-11-26 ENCOUNTER — Encounter: Payer: Self-pay | Admitting: Internal Medicine

## 2011-11-26 DIAGNOSIS — J019 Acute sinusitis, unspecified: Secondary | ICD-10-CM

## 2011-11-26 NOTE — Assessment & Plan Note (Signed)
Really seems to be viral at this point Discussed supportive care Would consider empiric antibiotic if worsening as the week goes on---she should call Dr Marvel Plan

## 2011-11-26 NOTE — Progress Notes (Signed)
  Subjective:    Patient ID: Tanya Daniels, female    DOB: 01-11-1956, 56 y.o.   MRN: [id number]  HPI Having bad head congestion hacky cough Body aches and feels mildly feverish Frontal headache  Started yesterday Some cough--dry Slight post nasal drip No SOB  Tried cough syrup and advil--?help slightly  Current Outpatient Prescriptions on File Prior to Visit  Medication Sig Dispense Refill  . escitalopram (LEXAPRO) 5 MG tablet Take 1 tablet (5 mg total) by mouth daily.  10 tablet  0  . fluvastatin XL (LESCOL XL) 80 MG 24 hr tablet Take 1 tablet (80 mg total) by mouth daily.  30 tablet  6  . Specialty Vitamins Products (HEART HEALTH PACK PO) Take 1 packet by mouth daily.          Allergies  Allergen Reactions  . Amoxicillin     REACTION: Stomach upset  . Erythromycin     REACTION: Stomach upset    Past Medical History  Diagnosis Date  . Depression   . Panic disorder   . Hyperlipidemia   . Hx of adenomatous colonic polyps 2002    TCS nl 2005 and 2009 (except oozing internal hemorrhoid tx'd w/ sclerotherapy 2009)) Dr Kinnie Scales  . Palpitations 2008    holter neg    Past Surgical History  Procedure Date  . Abdominal hysterectomy 1995    fibroids/ menorrhagia  . Cesarean section 1993  . Appendectomy 1975    Family History  Problem Relation Age of Onset  . Coronary artery disease Father 38  . Cancer Father     Possible lung cancer  . Diabetes Father     type 2  . Depression Father   . Heart attack Brother 60  . Hyperlipidemia Brother     History   Social History  . Marital Status: Married    Spouse Name: N/A    Number of Children: N/A  . Years of Education: N/A   Occupational History  . Not on file.   Social History Main Topics  . Smoking status: Never Smoker   . Smokeless tobacco: Never Used  . Alcohol Use: No  . Drug Use: No  . Sexually Active: Not on file   Other Topics Concern  . Not on file   Social History Narrative  . No narrative  on file   Review of Systems No vomiting or diarrhea Slight nausea yesterday Appetite is off No rash     Objective:   Physical Exam  Constitutional: She appears well-developed and well-nourished. No distress.  HENT:       Mild maxillary but not frontal tenderness TMs normal Mild nasal inflammation  Neck: Normal range of motion. Neck supple.  Pulmonary/Chest: Effort normal and breath sounds normal. No respiratory distress. She has no wheezes. She has no rales.  Lymphadenopathy:    She has no cervical adenopathy.          Assessment & Plan:

## 2011-11-28 ENCOUNTER — Ambulatory Visit (INDEPENDENT_AMBULATORY_CARE_PROVIDER_SITE_OTHER): Payer: BC Managed Care – PPO | Admitting: Family Medicine

## 2011-11-28 ENCOUNTER — Encounter: Payer: Self-pay | Admitting: Family Medicine

## 2011-11-28 ENCOUNTER — Encounter: Payer: Self-pay | Admitting: *Deleted

## 2011-11-28 VITALS — BP 105/70 | HR 87 | Temp 99.6°F | Wt 170.0 lb

## 2011-11-28 DIAGNOSIS — J209 Acute bronchitis, unspecified: Secondary | ICD-10-CM | POA: Insufficient documentation

## 2011-11-28 MED ORDER — CEFUROXIME AXETIL 500 MG PO TABS: 500.0000 mg | ORAL_TABLET | Freq: Two times a day (BID) | ORAL | Status: AC

## 2011-11-28 MED ORDER — METHYLPREDNISOLONE ACETATE PF 40 MG/ML IJ SUSP
40.0000 mg | Freq: Once | INTRAMUSCULAR | Status: AC
  Administered 2011-11-28: 40 mg via INTRAMUSCULAR

## 2011-11-28 MED ORDER — HYDROCODONE-HOMATROPINE 5-1.5 MG/5ML PO SYRP: ORAL_SOLUTION | ORAL | Status: AC

## 2011-11-28 NOTE — Assessment & Plan Note (Signed)
Worsening at day 5 of illness. Empiric abx to be started today: ceftin 500mg  bid x10d. Depo medrol 40mg  IM x 1 in office today. Hycodan susp 1-2 tsp q6h prn, #120 ml, no RF.  Therapeutic expectations and side effect profile of medication discussed today.  Patient's questions answered. Continue daytime symptomatic care like she's been doing. Work note/excuse done.

## 2011-11-28 NOTE — Progress Notes (Signed)
OFFICE NOTE  11/28/2011  CC:  Chief Complaint  Patient presents with  . URI    cough, congestion, was seen in Sat. Clinic     HPI: Patient is a 56 y.o. Caucasian female who is here for worsening acute respiratory illness. Progressively worsening over the last 4-5 days, cough has her up all night, HA, +nasal mucous/PND, chest tightness.  Very fatigued, generalized weakness. Was seen 2 d/a at Saturday clinic, dx'd with viral illness and told to call me or f/u here if worsening. Tm about 100.  Taking delsym, mucinex, vicks, nyquil, ibupr.   Pertinent PMH:  Past Medical History  Diagnosis Date  . Depression   . Panic disorder   . Hyperlipidemia   . Hx of adenomatous colonic polyps 2002    TCS nl 2005 and 2009 (except oozing internal hemorrhoid tx'd w/ sclerotherapy 2009)) Dr Kinnie Scales  . Palpitations 2008    holter neg   Past surgical, social, and family history reviewed and no changes noted since last office visit.  MEDS:  Outpatient Prescriptions Prior to Visit  Medication Sig Dispense Refill  . escitalopram (LEXAPRO) 5 MG tablet Take 1 tablet (5 mg total) by mouth daily.  10 tablet  0  . fluvastatin XL (LESCOL XL) 80 MG 24 hr tablet Take 1 tablet (80 mg total) by mouth daily.  30 tablet  6  . Specialty Vitamins Products (HEART HEALTH PACK PO) Take 1 packet by mouth daily.          PE: Blood pressure 105/70, pulse 87, temperature 99.6 F (37.6 C), temperature source Temporal, weight 170 lb (77.111 kg), SpO2 99.00%. Gen: alert, tired appearing, NAD.  Oriented x 4. VS: noted--normal. HEENT: eyes without injection, drainage, or swelling.  Ears: EACs clear, TMs with normal light reflex and landmarks.  Nose: Clear rhinorrhea, with some dried, crusty exudate adherent to mildly injected mucosa.  No purulent d/c.  Minimal left paranasal sinus TTP.  No facial swelling.  Throat and mouth without focal lesion.  No pharyngial swelling, erythema, or exudate.   Neck: supple, no LAD.     LUNGS: CTA bilat, nonlabored resps.  No prolonged exp phase, no post-exhalational coughing fits. CV: RRR, no m/r/g. EXT: no c/c/e SKIN: no rash    IMPRESSION AND PLAN: Acute bronchitis Worsening at day 5 of illness. Empiric abx to be started today: ceftin 500mg  bid x10d. Depo medrol 40mg  IM x 1 in office today. Hycodan susp 1-2 tsp q6h prn, #120 ml, no RF.  Therapeutic expectations and side effect profile of medication discussed today.  Patient's questions answered. Continue daytime symptomatic care like she's been doing. Work note/excuse done.     FOLLOW UP: prn

## 2012-01-24 ENCOUNTER — Other Ambulatory Visit: Payer: Self-pay | Admitting: Family Medicine

## 2012-01-24 MED ORDER — ESCITALOPRAM OXALATE 5 MG PO TABS: 5.0000 mg | ORAL_TABLET | Freq: Every day | ORAL | Status: DC

## 2012-01-24 NOTE — Telephone Encounter (Signed)
RX sent to pharmacy  

## 2012-02-23 ENCOUNTER — Ambulatory Visit (INDEPENDENT_AMBULATORY_CARE_PROVIDER_SITE_OTHER): Payer: BC Managed Care – PPO | Admitting: Family Medicine

## 2012-02-23 ENCOUNTER — Encounter: Payer: Self-pay | Admitting: Family Medicine

## 2012-02-23 VITALS — BP 107/72 | HR 77 | Ht 68.25 in | Wt 172.0 lb

## 2012-02-23 DIAGNOSIS — F411 Generalized anxiety disorder: Secondary | ICD-10-CM | POA: Insufficient documentation

## 2012-02-23 DIAGNOSIS — R079 Chest pain, unspecified: Secondary | ICD-10-CM

## 2012-02-23 DIAGNOSIS — K219 Gastro-esophageal reflux disease without esophagitis: Secondary | ICD-10-CM | POA: Insufficient documentation

## 2012-02-23 DIAGNOSIS — R0789 Other chest pain: Secondary | ICD-10-CM | POA: Insufficient documentation

## 2012-02-23 HISTORY — DX: Gastro-esophageal reflux disease without esophagitis: K21.9

## 2012-02-23 MED ORDER — ESCITALOPRAM OXALATE 10 MG PO TABS: 10.0000 mg | ORAL_TABLET | Freq: Every day | ORAL | Status: AC

## 2012-02-23 MED ORDER — ESCITALOPRAM OXALATE 10 MG PO TABS: 10.0000 mg | ORAL_TABLET | Freq: Every day | ORAL | Status: DC

## 2012-02-23 MED ORDER — ALPRAZOLAM 0.5 MG PO TABS: ORAL_TABLET | ORAL | Status: AC

## 2012-02-23 NOTE — Assessment & Plan Note (Signed)
Discussed dietary approaches needed, elevate head of bed a few inches, take prilosec 20mg  DAILY for 1 month plus an OTC H2 blocker qd-bid prn during this time. Recheck in office 1 mo.

## 2012-02-23 NOTE — Assessment & Plan Note (Signed)
With panic disorder as well. Plan is to get back on lexapro at the 10mg  dose, get back into previous exercise regimen. Will also treat prn with 0.5mg  xanax, 1-2 q8h prn, #30, RF x 1.  Therapeutic expectations and side effect profile of medication discussed today.  Patient's questions answered. F/u in office in 1 mo.

## 2012-02-23 NOTE — Assessment & Plan Note (Signed)
Multifactorial: musculoskeletal, acute on chronic stress, GERD. No sign of cardiopulmonary etiology.

## 2012-02-23 NOTE — Progress Notes (Signed)
OFFICE NOTE  02/23/2012  CC:  Chief Complaint  Patient presents with  . Chest Pain    very anxious, lots of stress-job change, older son issues, younger son graduating HS and going to college; feels like panic attack, increased Lexapro     HPI: Patient is a 56 y.o. Caucasian female who is here for chest pain. Reports a lot of stress building up lately. Describes central sternal chest pain, without any radiation.  Some days it nags her all day.  It borderlines on a burn as opposed to sharp pain or pressure.  PPI on one occasion helped. No trigger.  This is not exertional chest pain. Has excessive worries/life changes lately that she's having trouble with (job switch, two sons that are high maintenance), also out of her lexapro lately. Had some bad panic attacks last month, had to take a month off and went on vacation and these haven't been a problem.  She increased her lexapro from 5mg  to 10mg  and this helped but made her run out. Was exercising quite a bit earlier in the year (running, using personal trainer), but between frustration about wt not coming off and a switch in jobs this has fallen off lately. Says mood is down but "not actual depression".  Doesn't eat much but this is due to her worries about wt gain and not poor appetite. Admits she has GER and the pain feels most like burning from this.  Also admits that she has a bit of focal tenderness about golfball sized in central sternal region and a bit on each side of this in the costochondral region.    ROS: no nausea, no diaphoresis, no SOB, no dizziness, no arm pain or heaviness, no jaw pain, no palpitations, no fatigue, no focal weakness.  Pertinent PMH:  Past Medical History  Diagnosis Date  . Depression   . Panic disorder   . Hyperlipidemia   . Hx of adenomatous colonic polyps 2002    TCS nl 2005 and 2009 (except oozing internal hemorrhoid tx'd w/ sclerotherapy 2009)) Dr Kinnie Scales  . Palpitations 2008    holter neg   Past  surgical, social, and family history reviewed and no changes noted since last office visit except as noted above in HPI (new job and her youngest son is going to college in the fall to [employer]).  MEDS:  Outpatient Prescriptions Prior to Visit  Medication Sig Dispense Refill  . escitalopram (LEXAPRO) 5 MG tablet Take 1 tablet (5 mg total) by mouth daily.  30 tablet  0  . fluvastatin XL (LESCOL XL) 80 MG 24 hr tablet Take 1 tablet (80 mg total) by mouth daily.  30 tablet  6  . Specialty Vitamins Products (HEART HEALTH PACK PO) Take 1 packet by mouth daily.          PE: Blood pressure 107/72, pulse 77, height 5' 8.25" (1.734 m), weight 172 lb (78.019 kg), SpO2 98.00%. Gen: Alert, well appearing.  Patient is oriented to person, place, time, and situation. ENT: Ears: EACs clear, normal epithelium.  TMs with good light reflex and landmarks bilaterally.  Eyes: no injection, icteris, swelling, or exudate.  EOMI, PERRLA. Nose: no drainage or turbinate edema/swelling.  No injection or focal lesion.  Mouth: lips without lesion/swelling.  Oral mucosa pink and moist.  Dentition intact and without obvious caries or gingival swelling.  Oropharynx without erythema, exudate, or swelling.  Neck - No masses or thyromegaly or limitation in range of motion CV: RRR, no m/r/g.  LUNGS: CTA bilat, nonlabored resps, good aeration in all lung fields. EXT: no clubbing, cyanosis, or edema.   LABS: none today.  Recent:  Lab Results  Component Value Date   TSH 1.36 08/22/2011   Lab Results  Component Value Date   WBC 4.8 08/22/2011   HGB 12.3 08/22/2011   HCT 36.1 08/22/2011   MCV 86.7 08/22/2011   PLT 198.0 08/22/2011     Chemistry      Component Value Date/Time   NA 141 08/22/2011 0946   K 4.3 08/22/2011 0946   CL 107 08/22/2011 0946   CO2 26 08/22/2011 0946   BUN 13 08/22/2011 0946   CREATININE 0.9 08/22/2011 0946      Component Value Date/Time   CALCIUM 9.0 08/22/2011 0946   ALKPHOS 84  08/22/2011 0946   AST 28 08/22/2011 0946   ALT 19 08/22/2011 0946   BILITOT 0.7 08/22/2011 0946     Lab Results  Component Value Date   CHOL 236* 08/22/2011   HDL 61.90 08/22/2011   LDLCALC  Value: 152        Total Cholesterol/HDL:CHD Risk Coronary Heart Disease Risk Table                     Men   Women  1/2 Average Risk   3.4   3.3* 01/30/2008   LDLDIRECT 161.0 08/22/2011   TRIG 111.0 08/22/2011   CHOLHDL 4 08/22/2011    IMPRESSION AND PLAN:  Sternal pain Multifactorial: musculoskeletal, acute on chronic stress, GERD. No sign of cardiopulmonary etiology.  GAD (generalized anxiety disorder) With panic disorder as well. Plan is to get back on lexapro at the 10mg  dose, get back into previous exercise regimen. Will also treat prn with 0.5mg  xanax, 1-2 q8h prn, #30, RF x 1.  Therapeutic expectations and side effect profile of medication discussed today.  Patient's questions answered. F/u in office in 1 mo.  GERD (gastroesophageal reflux disease) Discussed dietary approaches needed, elevate head of bed a few inches, take prilosec 20mg  DAILY for 1 month plus an OTC H2 blocker qd-bid prn during this time. Recheck in office 1 mo.    FOLLOW UP: 23mo

## 2012-06-06 ENCOUNTER — Encounter: Payer: Self-pay | Admitting: Family Medicine

## 2012-06-06 ENCOUNTER — Ambulatory Visit (INDEPENDENT_AMBULATORY_CARE_PROVIDER_SITE_OTHER): Payer: BC Managed Care – PPO | Admitting: Family Medicine

## 2012-06-06 VITALS — BP 110/71 | HR 61 | Ht 68.25 in | Wt 176.0 lb

## 2012-06-06 DIAGNOSIS — F411 Generalized anxiety disorder: Secondary | ICD-10-CM

## 2012-06-06 DIAGNOSIS — R079 Chest pain, unspecified: Secondary | ICD-10-CM

## 2012-06-06 DIAGNOSIS — R0789 Other chest pain: Secondary | ICD-10-CM

## 2012-06-06 HISTORY — DX: Other chest pain: R07.89

## 2012-06-06 MED ORDER — ESCITALOPRAM OXALATE 20 MG PO TABS: 20.0000 mg | ORAL_TABLET | Freq: Every day | ORAL | Status: AC

## 2012-06-06 NOTE — Progress Notes (Signed)
OFFICE NOTE  06/06/2012  CC:  Chief Complaint  Patient presents with  . Anxiety    increased panic attacks     HPI: Patient is a 56 y.o. Caucasian female who is here for 3 month f/u generalized anxiety with panic.  Also hx of atypical chest pain.  Describes "panic" attacks happening more recently.  Says it starts with focal burning in central chest or between shoulder blades and this gradually radiates outward and is associated with heart racing.  Really bad ones also have lightheadedness.  These typically last 20-30 min total, peaks in or so. Occ some burning/tingling in neck and arms.  Occurs out of the blue, no trigger of any kind identified. Xanax use on occasion--leads to oversedation.   She currently exercises, mostly brisk walking, averages 20 miles per week.  Takes stairs at work.  None of this activity brings on chest pain or dyspnea.  Has more heartburn lately, takes zantac regularly and this helps very well.  Pertinent PMH:  Past Medical History  Diagnosis Date  . Depression   . Panic disorder   . Hyperlipidemia   . Hx of adenomatous colonic polyps 2002    TCS nl 2005 and 2009 (except oozing internal hemorrhoid tx'd w/ sclerotherapy 2009)) Dr Kinnie Scales  . Palpitations 2008    holter neg    MEDS:  Outpatient Prescriptions Prior to Visit  Medication Sig Dispense Refill  . ALPRAZolam (XANAX) 0.5 MG tablet 1-2 tabs po q8h prn anxiety  30 tablet  1  . aspirin EC 81 MG tablet Take 81 mg by mouth daily.      . fluvastatin XL (LESCOL XL) 80 MG 24 hr tablet Take 1 tablet (80 mg total) by mouth daily.  30 tablet  6  . Niacin (VITAMIN B-3 PO) Take 1 tablet by mouth daily.      Marland Kitchen escitalopram (LEXAPRO) 10 MG tablet Take 1 tablet (10 mg total) by mouth daily.  30 tablet  0  . Specialty Vitamins Products (HEART HEALTH PACK PO) Take 1 packet by mouth daily.        Marland Kitchen omeprazole (PRILOSEC OTC) 20 MG tablet Take 20 mg by mouth daily.        PE: Blood pressure 110/71, pulse 61,  height 5' 8.25" (1.734 m), weight 176 lb (79.833 kg). Gen: Alert, well appearing.  Patient is oriented to person, place, time, and situation. CV: RRR, no m/r/g.   LUNGS: CTA bilat, nonlabored resps, good aeration in all lung fields. EXT: no clubbing, cyanosis, or edema.   12 lead EKG: sinus bradycardia, rate 57, no ectopy.  TWI noted in V1 and V2, with flattened T waves in V3. No old EKG for comparison at this time.    IMPRESSION AND PLAN:  Atypical chest pain Will arrange exercise nuclear stress test through [facility].  GAD (generalized anxiety disorder) With panic. Recommended increase in lexapro to 20 mg once daily. She can still hold xanax for prn use.  Will try to get old EKG from [facility] cardiology (pt says she had stress test and ekg with them approx 8-10 yrs ago).  FOLLOW UP: 41mo, at which time we'll recheck fasting lipids and HDL labs

## 2012-06-06 NOTE — Assessment & Plan Note (Signed)
With panic. Recommended increase in lexapro to 20 mg once daily. She can still hold xanax for prn use.

## 2012-06-06 NOTE — Addendum Note (Signed)
Addended by: Jeoffrey Massed on: 06/06/2012 05:16 PM   Modules accepted: Orders

## 2012-06-06 NOTE — Assessment & Plan Note (Signed)
Will arrange exercise nuclear stress test through [facility].

## 2012-06-20 ENCOUNTER — Telehealth: Payer: Self-pay | Admitting: Family Medicine

## 2012-06-20 ENCOUNTER — Encounter: Payer: Self-pay | Admitting: Family Medicine

## 2012-06-20 NOTE — Telephone Encounter (Signed)
Patient is checking to see if the results from [facility] have come in yet, she had the test done 06/19/12. Please call her when they become available.

## 2012-06-20 NOTE — Telephone Encounter (Signed)
Results on your desk

## 2012-06-20 NOTE — Telephone Encounter (Signed)
Pls notify pt that her stress test was great: no sign of any problem with her heart.  Reassure her. -thx

## 2012-06-21 NOTE — Telephone Encounter (Signed)
I have attempted to contact this patient by phone with the following results: left message to return my call on answering machine.

## 2012-06-21 NOTE — Telephone Encounter (Signed)
Patient informed. 

## 2012-08-15 ENCOUNTER — Other Ambulatory Visit: Payer: BC Managed Care – PPO

## 2012-08-17 ENCOUNTER — Other Ambulatory Visit (INDEPENDENT_AMBULATORY_CARE_PROVIDER_SITE_OTHER): Payer: BC Managed Care – PPO

## 2012-08-17 DIAGNOSIS — Z0000 Encounter for general adult medical examination without abnormal findings: Secondary | ICD-10-CM

## 2012-08-17 LAB — HEPATIC FUNCTION PANEL
ALT: 25 U/L (ref 0–35)
Total Protein: 7.2 g/dL (ref 6.0–8.3)

## 2012-08-17 LAB — RENAL FUNCTION PANEL
Albumin: 4 g/dL (ref 3.5–5.2)
BUN: 15 mg/dL (ref 6–23)
CO2: 26 mEq/L (ref 19–32)
Calcium: 9.2 mg/dL (ref 8.4–10.5)
Creatinine, Ser: 0.8 mg/dL (ref 0.4–1.2)

## 2012-08-17 LAB — CBC
HCT: 41.8 % (ref 36.0–46.0)
Hemoglobin: 13.7 g/dL (ref 12.0–15.0)
MCHC: 32.7 g/dL (ref 30.0–36.0)
MCV: 85.9 fl (ref 78.0–100.0)
Platelets: 190 10*3/uL (ref 150.0–400.0)

## 2012-08-17 LAB — LIPID PANEL
Cholesterol: 227 mg/dL — ABNORMAL HIGH (ref 0–200)
Triglycerides: 131 mg/dL (ref 0.0–149.0)

## 2012-08-22 ENCOUNTER — Encounter: Payer: Self-pay | Admitting: Family Medicine

## 2012-08-22 ENCOUNTER — Ambulatory Visit (INDEPENDENT_AMBULATORY_CARE_PROVIDER_SITE_OTHER): Payer: BC Managed Care – PPO | Admitting: Family Medicine

## 2012-08-22 VITALS — BP 92/65 | HR 66 | Temp 97.8°F | Ht 68.25 in | Wt 179.0 lb

## 2012-08-22 DIAGNOSIS — R82998 Other abnormal findings in urine: Secondary | ICD-10-CM

## 2012-08-22 DIAGNOSIS — E785 Hyperlipidemia, unspecified: Secondary | ICD-10-CM

## 2012-08-22 DIAGNOSIS — F410 Panic disorder [episodic paroxysmal anxiety] without agoraphobia: Secondary | ICD-10-CM

## 2012-08-22 DIAGNOSIS — R3989 Other symptoms and signs involving the genitourinary system: Secondary | ICD-10-CM

## 2012-08-22 DIAGNOSIS — Z0000 Encounter for general adult medical examination without abnormal findings: Secondary | ICD-10-CM | POA: Insufficient documentation

## 2012-08-22 LAB — URINALYSIS, ROUTINE W REFLEX MICROSCOPIC
Leukocytes, UA: NEGATIVE
Nitrite: NEGATIVE
Total Protein, Urine: NEGATIVE

## 2012-08-22 LAB — POCT URINALYSIS DIPSTICK
Ketones, UA: NEGATIVE
Protein, UA: NEGATIVE
Spec Grav, UA: 1.025

## 2012-08-22 MED ORDER — ZOSTER VACCINE LIVE 19400 UNT/0.65ML ~~LOC~~ SOLR: 0.6500 mL | Freq: Once | SUBCUTANEOUS | Status: DC

## 2012-08-22 NOTE — Assessment & Plan Note (Signed)
Reviewed age and gender appropriate health maintenance issues (prudent diet, regular exercise, health risks of tobacco and excessive alcohol, use of seatbelts, fire alarms in home, use of sunscreen).  Also reviewed age and gender appropriate health screening as well as vaccine recommendations. Zostavax rx given today.  Recommended she start calcium 1500mg  and vit D 800 IU qd. She will call Dr. Jennye Boroughs office in spring of 2014 to set up repeat TCS due to her hx of adenomatous colon polyps. Recent labs reviewed: she'll restart daily lescol XL 80mg  instead of just taking it 2-3 times per day, and if urine turns brown again she'll return for lab visit to check urine and blood. She gets appropriate GYN screening by her GYN MD.

## 2012-08-22 NOTE — Progress Notes (Signed)
Office Note 08/30/2012  CC:  Chief Complaint  Patient presents with  . Annual Exam    wt gain, cholesterol    HPI:  Tanya Daniels is a 56 y.o. White female who is here for CPE. Since last visit she has had a normal rest/stress myoview for atypical chest pain. She has been taking the herbal medicine garcinia for wt loss purposes.  I have reviewed this med in my herbal med reference and it appears to be ok for use for her at this time. She has not had any panic attacks since the increase in lexapro to 20 mg qd.  She is very happy with this!. She has not been taking lescol more than 2-3 days per week b/c taking it daily made her urine turn brownish color.  It is not brown with taking it 2-3 days per week.  She recalls no abdominal pain, nausea, or discoloration of skin or whites of her eyes. She is frustrated with wt gain--slow and steady as she ages.  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  She describes a sensible diet.    Past Medical History  Diagnosis Date  . Depression   . Panic disorder   . Hyperlipidemia   . Hx of adenomatous colonic polyps 2002    TCS nl 2005 and 2009 (except oozing internal hemorrhoid tx'd w/ sclerotherapy 2009)) Dr Kinnie Scales  . Palpitations 2008    holter neg    Past Surgical History  Procedure Date  . Abdominal hysterectomy 1995    fibroids/ menorrhagia  . Cesarean section 1993  . Appendectomy 1975  . Cardiovascular stress test     exercise nuclear study 06/19/12 normal perfusion and normal function.    Family History  Problem Relation Age of Onset  . Coronary artery disease Father 3  . Cancer Father     Possible lung cancer  . Diabetes Father     type 2  . Depression Father   . Heart attack Brother 60  . Hyperlipidemia Brother     History   Social History  . Marital Status: Married    Spouse Name: N/A    Number of Children: N/A  . Years of  Education: N/A   Occupational History  . Not on file.   Social History Main Topics  . Smoking status: Never Smoker   . Smokeless tobacco: Never Used  . Alcohol Use: No  . Drug Use: No  . Sexually Active: Not on file   Other Topics Concern  . 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 aspirin EC 81 MG tablet Take 81 mg by mouth 3 (three) times a week.       . escitalopram (LEXAPRO) 20 MG tablet Take 1 tablet (20 mg total) by mouth daily.  30 tablet  5  . fluvastatin XL (LESCOL XL) 80 MG 24 hr tablet Take 1 tablet (80 mg total) by mouth daily.  30 tablet  6  . Niacin (VITAMIN B-3 PO) Take 1 tablet by mouth daily.      . ranitidine (ZANTAC) 150 MG capsule Take 150 mg by mouth 2 (two) times daily.      Marland Kitchen  Specialty Vitamins Products (HEART HEALTH PACK PO) Take 1 packet by mouth daily.          Allergies  Allergen Reactions  . Amoxicillin     REACTION: Stomach upset  . Erythromycin     REACTION: Stomach upset    ROS Review of Systems  Constitutional: Negative for fever, chills, appetite change and fatigue.  HENT: Negative for ear pain, congestion, sore throat, neck stiffness and dental problem.   Eyes: Negative for discharge, redness and visual disturbance.  Respiratory: Negative for cough, chest tightness, shortness of breath and wheezing.   Cardiovascular: Negative for chest pain, palpitations and leg swelling.  Gastrointestinal: Negative for nausea, vomiting, abdominal pain, diarrhea and blood in stool.  Genitourinary: Negative for dysuria, urgency, frequency, hematuria, flank pain and difficulty urinating.  Musculoskeletal: Negative for myalgias, back pain, joint swelling and arthralgias.  Skin: Negative  for pallor and rash.  Neurological: Negative for dizziness, speech difficulty, weakness and headaches.  Hematological: Negative for adenopathy. Does not bruise/bleed easily.  Psychiatric/Behavioral: Negative for confusion and sleep disturbance. The patient is not nervous/anxious.     PE; Blood pressure 92/65, pulse 66, temperature 97.8 F (36.6 C), temperature source Temporal, height 5' 8.25" (1.734 m), weight 179 lb (81.194 kg). Gen: Alert, well appearing.  Patient is oriented to person, place, time, and situation. AFFECT: pleasant, lucid thought and speech. ENT: Ears: EACs clear, normal epithelium.  TMs with good light reflex and landmarks bilaterally.  Eyes: no injection, icteris, swelling, or exudate.  EOMI, PERRLA. Nose: no drainage or turbinate edema/swelling.  No injection or focal lesion.  Mouth: lips without lesion/swelling.  Oral mucosa pink and moist.  Dentition intact and without obvious caries or gingival swelling.  Oropharynx without erythema, exudate, or swelling.  Neck: supple/nontender.  No LAD, mass, or TM.  Carotid pulses 2+ bilaterally, without bruits. CV: RRR, no m/r/g.   LUNGS: CTA bilat, nonlabored resps, good aeration in all lung fields. ABD: soft, NT, ND, BS normal.  No hepatospenomegaly or mass.  No bruits. EXT: no clubbing, cyanosis, or edema.  Musculoskeletal: no joint swelling, erythema, warmth, or tenderness.  ROM of all joints intact. Skin - no sores or rashes or color changes.  She has one nevus on right upper back with peppered color--pt unsure if this is a change or not and she says she would like me to remove it.  Pertinent labs:  UA today  ASSESSMENT AND PLAN:   Health maintenance examination Reviewed age and gender appropriate health maintenance issues (prudent diet, regular exercise, health risks of tobacco and excessive alcohol, use of seatbelts, fire alarms in home, use of sunscreen).  Also reviewed age and gender appropriate health screening as well  as vaccine recommendations. Zostavax rx given today.  Recommended she start calcium 1500mg  and vit D 800 IU qd. She will call Dr. Jennye Boroughs office in spring of 2014 to set up repeat TCS due to her hx of adenomatous colon polyps. Recent labs reviewed: she'll restart daily lescol XL 80mg  instead of just taking it 2-3 times per day, and if urine turns brown again she'll return for lab visit to check urine and blood. She gets appropriate GYN screening by her GYN MD.   An After Visit Summary was printed and given to the patient.   FOLLOW UP:  Return in about 7 months (around 03/22/2013) for f/u anxiety d/o.

## 2012-08-22 NOTE — Patient Instructions (Signed)
Take 1500 mg of calcium and 800 IU of vitamin D. Resume DAILY lescol intake.  If/when urine discoloration occurs, then call and make lab visit appt for a urine and blood check.  Health Maintenance, Females A healthy lifestyle and preventative care can promote health and wellness.  Maintain regular health, dental, and eye exams.  Eat a healthy diet. Foods like vegetables, fruits, whole grains, low-fat dairy products, and lean protein foods contain the nutrients you need without too many calories. Decrease your intake of foods high in solid fats, added sugars, and salt. Get information about a proper diet from your caregiver, if necessary.  Regular physical exercise is one of the most important things you can do for your health. Most adults should get at least 150 minutes of moderate-intensity exercise (any activity that increases your heart rate and causes you to sweat) each week. In addition, most adults need muscle-strengthening exercises on 2 or more days a week.   Maintain a healthy weight. The body mass index (BMI) is a screening tool to identify possible weight problems. It provides an estimate of body fat based on height and weight. Your caregiver can help determine your BMI, and can help you achieve or maintain a healthy weight. For adults 20 years and older:  A BMI below 18.5 is considered underweight.  A BMI of 18.5 to 24.9 is normal.  A BMI of 25 to 29.9 is considered overweight.  A BMI of 30 and above is considered obese.  Maintain normal blood lipids and cholesterol by exercising and minimizing your intake of saturated fat. Eat a balanced diet with plenty of fruits and vegetables. Blood tests for lipids and cholesterol should begin at age 80 and be repeated every 5 years. If your lipid or cholesterol levels are high, you are over 50, or you are a high risk for heart disease, you may need your cholesterol levels checked more frequently.Ongoing high lipid and cholesterol levels  should be treated with medicines if diet and exercise are not effective.  If you smoke, find out from your caregiver how to quit. If you do not use tobacco, do not start.  If you are pregnant, do not drink alcohol. If you are breastfeeding, be very cautious about drinking alcohol. If you are not pregnant and choose to drink alcohol, do not exceed 1 drink per day. One drink is considered to be 12 ounces (355 mL) of beer, 5 ounces (148 mL) of wine, or 1.5 ounces (44 mL) of liquor.  Avoid use of street drugs. Do not share needles with anyone. Ask for help if you need support or instructions about stopping the use of drugs.  High blood pressure causes heart disease and increases the risk of stroke. Blood pressure should be checked at least every 1 to 2 years. Ongoing high blood pressure should be treated with medicines, if weight loss and exercise are not effective.  If you are 41 to 56 years old, ask your caregiver if you should take aspirin to prevent strokes.  Diabetes screening involves taking a blood sample to check your fasting blood sugar level. This should be done once every 3 years, after age 51, if you are within normal weight and without risk factors for diabetes. Testing should be considered at a younger age or be carried out more frequently if you are overweight and have at least 1 risk factor for diabetes.  Breast cancer screening is essential preventative care for women. You should practice "breast self-awareness." This means understanding  the normal appearance and feel of your breasts and may include breast self-examination. Any changes detected, no matter how small, should be reported to a caregiver. Women in their 74s and 30s should have a clinical breast exam (CBE) by a caregiver as part of a regular health exam every 1 to 3 years. After age 66, women should have a CBE every year. Starting at age 52, women should consider having a mammogram (breast X-ray) every year. Women who have a  family history of breast cancer should talk to their caregiver about genetic screening. Women at a high risk of breast cancer should talk to their caregiver about having an MRI and a mammogram every year.  The Pap test is a screening test for cervical cancer. Women should have a Pap test starting at age 35. Between ages 74 and 1, Pap tests should be repeated every 2 years. Beginning at age 55, you should have a Pap test every 3 years as long as the past 3 Pap tests have been normal. If you had a hysterectomy for a problem that was not cancer or a condition that could lead to cancer, then you no longer need Pap tests. If you are between ages 28 and 28, and you have had normal Pap tests going back 10 years, you no longer need Pap tests. If you have had past treatment for cervical cancer or a condition that could lead to cancer, you need Pap tests and screening for cancer for at least 20 years after your treatment. If Pap tests have been discontinued, risk factors (such as a new sexual partner) need to be reassessed to determine if screening should be resumed. Some women have medical problems that increase the chance of getting cervical cancer. In these cases, your caregiver may recommend more frequent screening and Pap tests.  The human papillomavirus (HPV) test is an additional test that may be used for cervical cancer screening. The HPV test looks for the virus that can cause the cell changes on the cervix. The cells collected during the Pap test can be tested for HPV. The HPV test could be used to screen women aged 24 years and older, and should be used in women of any age who have unclear Pap test results. After the age of 28, women should have HPV testing at the same frequency as a Pap test.  Colorectal cancer can be detected and often prevented. Most routine colorectal cancer screening begins at the age of 57 and continues through age 35. However, your caregiver may recommend screening at an earlier age  if you have risk factors for colon cancer. On a yearly basis, your caregiver may provide home test kits to check for hidden blood in the stool. Use of a small camera at the end of a tube, to directly examine the colon (sigmoidoscopy or colonoscopy), can detect the earliest forms of colorectal cancer. Talk to your caregiver about this at age 53, when routine screening begins. Direct examination of the colon should be repeated every 5 to 10 years through age 28, unless early forms of pre-cancerous polyps or small growths are found.  Hepatitis C blood testing is recommended for all people born from [birth year] through 1965 and any individual with known risks for hepatitis C.  Practice safe sex. Use condoms and avoid high-risk sexual practices to reduce the spread of sexually transmitted infections (STIs). Sexually active women aged 7 and younger should be checked for Chlamydia, which is a common sexually transmitted infection. Older women  with new or multiple partners should also be tested for Chlamydia. Testing for other STIs is recommended if you are sexually active and at increased risk.  Osteoporosis is a disease in which the bones lose minerals and strength with aging. This can result in serious bone fractures. The risk of osteoporosis can be identified using a bone density scan. Women ages 30 and over and women at risk for fractures or osteoporosis should discuss screening with their caregivers. Ask your caregiver whether you should be taking a calcium supplement or vitamin D to reduce the rate of osteoporosis.  Menopause can be associated with physical symptoms and risks. Hormone replacement therapy is available to decrease symptoms and risks. You should talk to your caregiver about whether hormone replacement therapy is right for you.  Use sunscreen with a sun protection factor (SPF) of 30 or greater. Apply sunscreen liberally and repeatedly throughout the day. You should seek shade when your shadow is  shorter than you. Protect yourself by wearing long sleeves, pants, a wide-brimmed hat, and sunglasses year round, whenever you are outdoors.  Notify your caregiver of new moles or changes in moles, especially if there is a change in shape or color. Also notify your caregiver if a mole is larger than the size of a pencil eraser.  Stay current with your immunizations. Document Released: 04/04/2011 Document Revised: 12/12/2011 Document Reviewed: 04/04/2011 [facility] Patient Information 2013 [place], [place].

## 2013-02-06 ENCOUNTER — Telehealth: Payer: Self-pay | Admitting: Family Medicine

## 2013-02-06 NOTE — Telephone Encounter (Signed)
Please advise 

## 2013-02-07 MED ORDER — BUPROPION HCL ER (XL) 150 MG PO TB24: 150.0000 mg | ORAL_TABLET | Freq: Every day | ORAL | Status: DC

## 2013-02-07 NOTE — Telephone Encounter (Signed)
OK to get off of lexapro but I recommend she take 1/2 tab of her lexapro daily for a week, then stop it. I'll do wellbutrin 150mg  eRx and she can start 1 tab once daily after she has stopped her lexapro.

## 2013-02-08 NOTE — Telephone Encounter (Signed)
Patient advised.

## 2013-03-18 ENCOUNTER — Ambulatory Visit: Payer: BC Managed Care – PPO | Admitting: Nurse Practitioner

## 2013-05-15 ENCOUNTER — Telehealth: Payer: Self-pay | Admitting: Family Medicine

## 2013-05-15 NOTE — Telephone Encounter (Signed)
She should go ahead and wait until her annual physical.-thx

## 2013-05-15 NOTE — Telephone Encounter (Signed)
Please advise lab visit or office visit.

## 2013-05-16 NOTE — Telephone Encounter (Signed)
Left detailed message stating patient should wait until her physical to talk to Dr about making any med changes.

## 2013-06-27 ENCOUNTER — Telehealth: Payer: Self-pay | Admitting: Family Medicine

## 2013-06-27 MED ORDER — BUPROPION HCL ER (XL) 150 MG PO TB24: 150.0000 mg | ORAL_TABLET | Freq: Every day | ORAL | Status: DC

## 2013-06-27 NOTE — Telephone Encounter (Signed)
Patient left message stating she was out of her wellbutrin.  I sent in 30 day supply to [facility].  Please call patient and tell her for anymore refills she will need to be seen.  She was supposed to have a follow up in June.  Thanks.

## 2013-06-27 NOTE — Telephone Encounter (Signed)
LM on patient's cell to CB. No okay to leave detailed message.

## 2013-07-05 NOTE — Telephone Encounter (Signed)
Left another message today for patient to call to make an appt.

## 2013-07-11 NOTE — Telephone Encounter (Signed)
Patient has scheduled an appt for 07/17/13

## 2013-07-17 ENCOUNTER — Encounter: Payer: Self-pay | Admitting: Family Medicine

## 2013-07-17 ENCOUNTER — Ambulatory Visit (INDEPENDENT_AMBULATORY_CARE_PROVIDER_SITE_OTHER): Payer: BC Managed Care – PPO | Admitting: Family Medicine

## 2013-07-17 VITALS — BP 107/71 | HR 80 | Temp 98.0°F | Resp 18 | Ht 68.0 in | Wt 178.0 lb

## 2013-07-17 DIAGNOSIS — F418 Other specified anxiety disorders: Secondary | ICD-10-CM

## 2013-07-17 DIAGNOSIS — K219 Gastro-esophageal reflux disease without esophagitis: Secondary | ICD-10-CM

## 2013-07-17 DIAGNOSIS — E785 Hyperlipidemia, unspecified: Secondary | ICD-10-CM

## 2013-07-17 DIAGNOSIS — F341 Dysthymic disorder: Secondary | ICD-10-CM

## 2013-07-17 MED ORDER — LOVASTATIN 40 MG PO TABS: 40.0000 mg | ORAL_TABLET | Freq: Every day | ORAL | Status: DC

## 2013-07-17 MED ORDER — ZOSTER VACCINE LIVE 19400 UNT/0.65ML ~~LOC~~ SOLR: 0.6500 mL | Freq: Once | SUBCUTANEOUS | Status: DC

## 2013-07-17 NOTE — Progress Notes (Signed)
OFFICE NOTE  07/17/2013  CC:  Chief Complaint  Patient presents with  . Medication Refill     HPI: Patient is a 57 y.o. Caucasian female who is here for depression w/ anxiety and hyperlipidemia.  I last saw her about 11 mo ago. She has had her flu vaccine already this season.  Working 12 hour days, no time for exercise. Still fights a panic attack off with deep breathing still every now and then. She stopped lescol due to a persistent left upper chest/sternal burning discomfort it seemed to cause.  When she stopped this med the discomfort stopped happening.  She has also been intolerant of atorv in the past. GERD bothering her so she started OTC nexium 20mg  qd and this has helped a lot as has adjusting diet.   Pertinent PMH:  Past Medical History  Diagnosis Date  . Depression   . Panic disorder   . Hyperlipidemia   . Hx of adenomatous colonic polyps 2002    TCS nl 2005 and 2009 (except oozing internal hemorrhoid tx'd w/ sclerotherapy 2009)) Dr Kinnie Scales  . Palpitations 2008    holter neg   Past Surgical History  Procedure Laterality Date  . Abdominal hysterectomy  1995    fibroids/ menorrhagia  . Cesarean section  1993  . Appendectomy  1975  . Cardiovascular stress test      exercise nuclear study 06/19/12 normal perfusion and normal function.   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   Married, 2 sons (one will start at [employer] in fall 2013--her alma mater).   Never Smoked   Alcohol use-no   Drug use-no   Regular exercise-yes              MEDS:  Outpatient Prescriptions Prior to Visit  Medication Sig Dispense Refill  . aspirin EC 81 MG tablet Take 81 mg by mouth 3 (three) times a week.       Marland Kitchen buPROPion (WELLBUTRIN XL) 150 MG 24 hr tablet Take 1 tablet (150 mg total) by mouth daily.  30 tablet  0  . b complex vitamins tablet Take 1 tablet by mouth daily.      . fluvastatin XL (LESCOL XL) 80 MG 24  hr tablet Take 1 tablet (80 mg total) by mouth daily.  30 tablet  6  . GARCINIA CAMBOGIA-CHROMIUM PO Take 1 tablet by mouth 3 (three) times daily with meals.      . zoster vaccine live, PF, (ZOSTAVAX) 40981 UNT/0.65ML injection Inject 19,400 Units into the skin once.  1 vial  0   No facility-administered medications prior to visit.    PE: Blood pressure 107/71, pulse 80, temperature 98 F (36.7 C), temperature source Temporal, resp. rate 18, height 5\' 8"  (1.727 m), weight 178 lb (80.74 kg), SpO2 98.00%. Gen: Alert, well appearing.  Patient is oriented to person, place, time, and situation. AFFECT: pleasant, lucid thought and speech. No further exam today  IMPRESSION AND PLAN:  1) Hyperlipidemia: intolerant of atorv and fluva. Will start trial of lovastatin 40mg  qd.  2) GERD; doing much better with OTC nexium (she'll do 2 wk course) and dietary adjustments.  3) Dep w/anx: stable.  Continue wellbutrin XL.  FOLLOW UP: she has appt next month for CPE with fasting labs

## 2013-07-29 ENCOUNTER — Other Ambulatory Visit: Payer: Self-pay | Admitting: Family Medicine

## 2013-07-29 MED ORDER — BUPROPION HCL ER (XL) 150 MG PO TB24: 150.0000 mg | ORAL_TABLET | Freq: Every day | ORAL | Status: DC

## 2013-08-20 ENCOUNTER — Other Ambulatory Visit: Payer: Self-pay | Admitting: Family Medicine

## 2013-08-20 DIAGNOSIS — Z0000 Encounter for general adult medical examination without abnormal findings: Secondary | ICD-10-CM

## 2013-08-23 ENCOUNTER — Other Ambulatory Visit: Payer: Self-pay | Admitting: Family Medicine

## 2013-08-23 ENCOUNTER — Other Ambulatory Visit: Payer: BC Managed Care – PPO

## 2013-08-23 LAB — LIPID PANEL
Cholesterol: 193 mg/dL (ref 0–200)
HDL: 55 mg/dL (ref >39)
LDL Cholesterol: 110 mg/dL — ABNORMAL HIGH (ref 0–99)
Total CHOL/HDL Ratio: 3.5 Ratio

## 2013-08-23 LAB — COMPREHENSIVE METABOLIC PANEL
AST: 18 U/L (ref 0–37)
BUN: 14 mg/dL (ref 6–23)
CO2: 27 mEq/L (ref 19–32)
Calcium: 9.4 mg/dL (ref 8.4–10.5)
Chloride: 102 mEq/L (ref 96–112)
Creat: 0.89 mg/dL (ref 0.50–1.10)
Total Bilirubin: 0.5 mg/dL (ref 0.3–1.2)

## 2013-08-23 LAB — CBC WITH DIFFERENTIAL/PLATELET
Basophils Absolute: 0.1 10*3/uL (ref 0.0–0.1)
Basophils Relative: 1 % (ref 0–1)
Eosinophils Absolute: 0.2 10*3/uL (ref 0.0–0.7)
Eosinophils Relative: 4 % (ref 0–5)
HCT: 42.8 % (ref 36.0–46.0)
Hemoglobin: 14.7 g/dL (ref 12.0–15.0)
Lymphocytes Relative: 30 % (ref 12–46)
MCH: 28.7 pg (ref 26.0–34.0)
MCHC: 34.3 g/dL (ref 30.0–36.0)
MCV: 83.4 fL (ref 78.0–100.0)
Monocytes Absolute: 0.4 10*3/uL (ref 0.1–1.0)
Monocytes Relative: 7 % (ref 3–12)
RDW: 13.8 % (ref 11.5–15.5)

## 2013-08-23 LAB — TSH: TSH: 1.435 u[IU]/mL (ref 0.350–4.500)

## 2013-09-03 ENCOUNTER — Encounter: Payer: Self-pay | Admitting: Family Medicine

## 2013-09-03 ENCOUNTER — Ambulatory Visit (INDEPENDENT_AMBULATORY_CARE_PROVIDER_SITE_OTHER): Payer: BC Managed Care – PPO | Admitting: Family Medicine

## 2013-09-03 VITALS — BP 97/64 | HR 69 | Temp 98.4°F | Resp 18 | Ht 68.0 in | Wt 178.0 lb

## 2013-09-03 DIAGNOSIS — Z0000 Encounter for general adult medical examination without abnormal findings: Secondary | ICD-10-CM

## 2013-09-03 NOTE — Assessment & Plan Note (Signed)
Reviewed age and gender appropriate health maintenance issues (prudent diet, regular exercise, health risks of tobacco and excessive alcohol, use of seatbelts, fire alarms in home, use of sunscreen).  Also reviewed age and gender appropriate health screening as well as vaccine recommendations. She is UTD on vaccines, including flu for this season. She is set up for repeat colonoscopy with Dr. Kinnie Scales for her hx of adenomatous colon polyps. She will be arranging annual mammogram through [facility] soon, as well as getting in to see her dermatologist for a complete skin check + discussion of excision of the atypical-appearing nevus on upper back. All labs reviewed today: very good.  We reviewed Calcium and vit D supplement recommendations for women and she said she'll try to get into routine of taking these. Continue all current med regimen.

## 2013-09-03 NOTE — Patient Instructions (Addendum)
Take OTC calcium prep and take 1500 mg total daily dose and take 800 IU's of Vit D daily.Health Maintenance, Female A healthy lifestyle and preventative care can promote health and wellness.  Maintain regular health, dental, and eye exams.  Eat a healthy diet. Foods like vegetables, fruits, whole grains, low-fat dairy products, and lean protein foods contain the nutrients you need without too many calories. Decrease your intake of foods high in solid fats, added sugars, and salt. Get information about a proper diet from your caregiver, if necessary.  Regular physical exercise is one of the most important things you can do for your health. Most adults should get at least 150 minutes of moderate-intensity exercise (any activity that increases your heart rate and causes you to sweat) each week. In addition, most adults need muscle-strengthening exercises on 2 or more days a week.   Maintain a healthy weight. The body mass index (BMI) is a screening tool to identify possible weight problems. It provides an estimate of body fat based on height and weight. Your caregiver can help determine your BMI, and can help you achieve or maintain a healthy weight. For adults 20 years and older:  A BMI below 18.5 is considered underweight.  A BMI of 18.5 to 24.9 is normal.  A BMI of 25 to 29.9 is considered overweight.  A BMI of 30 and above is considered obese.  Maintain normal blood lipids and cholesterol by exercising and minimizing your intake of saturated fat. Eat a balanced diet with plenty of fruits and vegetables. Blood tests for lipids and cholesterol should begin at age 67 and be repeated every 5 years. If your lipid or cholesterol levels are high, you are over 50, or you are a high risk for heart disease, you may need your cholesterol levels checked more frequently.Ongoing high lipid and cholesterol levels should be treated with medicines if diet and exercise are not effective.  If you smoke, find  out from your caregiver how to quit. If you do not use tobacco, do not start.  Lung cancer screening is recommended for adults aged 87 80 years who are at high risk for developing lung cancer because of a history of smoking. Yearly low-dose computed tomography (CT) is recommended for people who have at least a 30-pack-year history of smoking and are a current smoker or have quit within the past 15 years. A pack year of smoking is smoking an average of 1 pack of cigarettes a day for 1 year (for example: 1 pack a day for 30 years or 2 packs a day for 15 years). Yearly screening should continue until the smoker has stopped smoking for at least 15 years. Yearly screening should also be stopped for people who develop a health problem that would prevent them from having lung cancer treatment.  If you are pregnant, do not drink alcohol. If you are breastfeeding, be very cautious about drinking alcohol. If you are not pregnant and choose to drink alcohol, do not exceed 1 drink per day. One drink is considered to be 12 ounces (355 mL) of beer, 5 ounces (148 mL) of wine, or 1.5 ounces (44 mL) of liquor.  Avoid use of street drugs. Do not share needles with anyone. Ask for help if you need support or instructions about stopping the use of drugs.  High blood pressure causes heart disease and increases the risk of stroke. Blood pressure should be checked at least every 1 to 2 years. Ongoing high blood pressure should  be treated with medicines, if weight loss and exercise are not effective.  If you are 20 to 57 years old, ask your caregiver if you should take aspirin to prevent strokes.  Diabetes screening involves taking a blood sample to check your fasting blood sugar level. This should be done once every 3 years, after age 40, if you are within normal weight and without risk factors for diabetes. Testing should be considered at a younger age or be carried out more frequently if you are overweight and have at least  1 risk factor for diabetes.  Breast cancer screening is essential preventative care for women. You should practice "breast self-awareness." This means understanding the normal appearance and feel of your breasts and may include breast self-examination. Any changes detected, no matter how small, should be reported to a caregiver. Women in their 30s and 30s should have a clinical breast exam (CBE) by a caregiver as part of a regular health exam every 1 to 3 years. After age 55, women should have a CBE every year. Starting at age 62, women should consider having a mammogram (breast X-ray) every year. Women who have a family history of breast cancer should talk to their caregiver about genetic screening. Women at a high risk of breast cancer should talk to their caregiver about having an MRI and a mammogram every year.  Breast cancer gene (BRCA)-related cancer risk assessment is recommended for women who have family members with BRCA-related cancers. BRCA-related cancers include breast, ovarian, tubal, and peritoneal cancers. Having family members with these cancers may be associated with an increased risk for harmful changes (mutations) in the breast cancer genes BRCA1 and BRCA2. Results of the assessment will determine the need for genetic counseling and BRCA1 and BRCA2 testing.  The Pap test is a screening test for cervical cancer. Women should have a Pap test starting at age 42. Between ages 83 and 63, Pap tests should be repeated every 2 years. Beginning at age 33, you should have a Pap test every 3 years as long as the past 3 Pap tests have been normal. If you had a hysterectomy for a problem that was not cancer or a condition that could lead to cancer, then you no longer need Pap tests. If you are between ages 54 and 1, and you have had normal Pap tests going back 10 years, you no longer need Pap tests. If you have had past treatment for cervical cancer or a condition that could lead to cancer, you need  Pap tests and screening for cancer for at least 20 years after your treatment. If Pap tests have been discontinued, risk factors (such as a new sexual partner) need to be reassessed to determine if screening should be resumed. Some women have medical problems that increase the chance of getting cervical cancer. In these cases, your caregiver may recommend more frequent screening and Pap tests.  The human papillomavirus (HPV) test is an additional test that may be used for cervical cancer screening. The HPV test looks for the virus that can cause the cell changes on the cervix. The cells collected during the Pap test can be tested for HPV. The HPV test could be used to screen women aged 27 years and older, and should be used in women of any age who have unclear Pap test results. After the age of 76, women should have HPV testing at the same frequency as a Pap test.  Colorectal cancer can be detected and often prevented. Most  routine colorectal cancer screening begins at the age of 51 and continues through age 49. However, your caregiver may recommend screening at an earlier age if you have risk factors for colon cancer. On a yearly basis, your caregiver may provide home test kits to check for hidden blood in the stool. Use of a small camera at the end of a tube, to directly examine the colon (sigmoidoscopy or colonoscopy), can detect the earliest forms of colorectal cancer. Talk to your caregiver about this at age 67, when routine screening begins. Direct examination of the colon should be repeated every 5 to 10 years through age 34, unless early forms of pre-cancerous polyps or small growths are found.  Hepatitis C blood testing is recommended for all people born from [birth year] through 1965 and any individual with known risks for hepatitis C.  Practice safe sex. Use condoms and avoid high-risk sexual practices to reduce the spread of sexually transmitted infections (STIs). Sexually active women aged 69 and  younger should be checked for Chlamydia, which is a common sexually transmitted infection. Older women with new or multiple partners should also be tested for Chlamydia. Testing for other STIs is recommended if you are sexually active and at increased risk.  Osteoporosis is a disease in which the bones lose minerals and strength with aging. This can result in serious bone fractures. The risk of osteoporosis can be identified using a bone density scan. Women ages 62 and over and women at risk for fractures or osteoporosis should discuss screening with their caregivers. Ask your caregiver whether you should be taking a calcium supplement or vitamin D to reduce the rate of osteoporosis.  Menopause can be associated with physical symptoms and risks. Hormone replacement therapy is available to decrease symptoms and risks. You should talk to your caregiver about whether hormone replacement therapy is right for you.  Use sunscreen. Apply sunscreen liberally and repeatedly throughout the day. You should seek shade when your shadow is shorter than you. Protect yourself by wearing long sleeves, pants, a wide-brimmed hat, and sunglasses year round, whenever you are outdoors.  Notify your caregiver of new moles or changes in moles, especially if there is a change in shape or color. Also notify your caregiver if a mole is larger than the size of a pencil eraser.  Stay current with your immunizations. Document Released: 04/04/2011 Document Revised: 01/14/2013 Document Reviewed: 04/04/2011 [facility] Patient Information 2014 [place].

## 2013-09-03 NOTE — Progress Notes (Signed)
Office Note 09/03/2013  CC:  Chief Complaint  Patient presents with  . Annual Exam    HPI:  Tanya Daniels is a 57 y.o. White female who is here for annual CPE. Feeling well.  Still with low level anxiety chronically but she says she is dealing with this fine. She says all panic attacks have subsided and she attributes this to use of wellbutrin. Colonoscopy is scheduled w/Dr. Kinnie Scales 10/21/13. Mammogram due, pt to schedule. GYN visit last year was good.  Pt not candidate for cerv canc screen. Reviewed labs today: all good, plan to continue current meds: she takes the mevacor qod.   Past Medical History  Diagnosis Date  . Depression   . Hyperlipidemia   . Hx of adenomatous colonic polyps 2002    TCS nl 2005 and 2009 (except oozing internal hemorrhoid tx'd w/ sclerotherapy 2009)) Dr Kinnie Scales  . Palpitations 2008    holter neg  . Atypical chest pain 06/06/2012  . GERD (gastroesophageal reflux disease) 02/23/2012  . Panic disorder without agoraphobia 09/29/2010  . Depression with anxiety     Past Surgical History  Procedure Laterality Date  . Abdominal hysterectomy  1995    fibroids/ menorrhagia (ovaries remain)  . Cesarean section  1993  . Appendectomy  1975  . Cardiovascular stress test      exercise nuclear study 06/19/12 normal perfusion and normal function.    Family History  Problem Relation Age of Onset  . Coronary artery disease Father 31  . Cancer Father     Possible lung cancer  . Diabetes Father     type 2  . Depression Father   . Heart attack Brother 60  . Hyperlipidemia Brother     History   Social History  . Marital Status: Married    Spouse Name: N/A    Number of Children: N/A  . Years of Education: N/A   Occupational History  . Not on file.   Social History Main Topics  . Smoking status: Never Smoker   . Smokeless tobacco: Never Used  . Alcohol Use: No  . Drug Use: No  . Sexual Activity: Not on file   Other Topics Concern  . 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   Married, 2 sons, (one goes to [employer]her Product/process development scientist).   Never Smoked   Alcohol use-no   Drug use-no   Regular exercise-yes                Outpatient Prescriptions Prior to Visit  Medication Sig Dispense Refill  . aspirin EC 81 MG tablet Take 81 mg by mouth 3 (three) times a week.       Marland Kitchen b complex vitamins tablet Take 1 tablet by mouth daily.      Marland Kitchen buPROPion (WELLBUTRIN XL) 150 MG 24 hr tablet Take 1 tablet (150 mg total) by mouth daily.  30 tablet  3  . esomeprazole (NEXIUM) 20 MG capsule Take 20 mg by mouth daily before breakfast.      . lovastatin (MEVACOR) 40 MG tablet Take 1 tablet (40 mg total) by mouth at bedtime.  30 tablet  3  . zoster vaccine live, PF, (ZOSTAVAX) 16109 UNT/0.65ML injection Inject 19,400 Units into the skin once.  1 vial  0  . fluvastatin XL (LESCOL XL) 80 MG 24 hr tablet Take 1 tablet (80 mg total) by mouth daily.  30 tablet  6  .  GARCINIA CAMBOGIA-CHROMIUM PO Take 1 tablet by mouth 3 (three) times daily with meals.       No facility-administered medications prior to visit.    Allergies  Allergen Reactions  . Amoxicillin     REACTION: Stomach upset  . Erythromycin     REACTION: Stomach upset    ROS Review of Systems  Constitutional: Negative for fever, chills, appetite change and fatigue.  HENT: Negative for congestion, dental problem, ear pain and sore throat.   Eyes: Negative for discharge, redness and visual disturbance.  Respiratory: Negative for cough, chest tightness, shortness of breath and wheezing.   Cardiovascular: Negative for chest pain, palpitations and leg swelling.  Gastrointestinal: Negative for nausea, vomiting, abdominal pain, diarrhea and blood in stool.  Genitourinary: Negative for dysuria, urgency, frequency, hematuria, flank pain and difficulty urinating.  Musculoskeletal: Negative for arthralgias, back pain, joint swelling,  myalgias and neck stiffness.  Skin: Negative for pallor and rash.  Neurological: Negative for dizziness, speech difficulty, weakness and headaches.  Hematological: Negative for adenopathy. Does not bruise/bleed easily.  Psychiatric/Behavioral: Negative for confusion, sleep disturbance and dysphoric mood. The patient is nervous/anxious (chronic).      PE; Blood pressure 97/64, pulse 69, temperature 98.4 F (36.9 C), temperature source Temporal, resp. rate 18, height 5\' 8"  (1.727 m), weight 178 lb (80.74 kg), SpO2 97.00%. Gen: Alert, well appearing.  Patient is oriented to person, place, time, and situation. AFFECT: pleasant, lucid thought and speech. ENT: Ears: EACs clear, normal epithelium.  TMs with good light reflex and landmarks bilaterally.  Eyes: no injection, icteris, swelling, or exudate.  EOMI, PERRLA. Nose: no drainage or turbinate edema/swelling.  No injection or focal lesion.  Mouth: lips without lesion/swelling.  Oral mucosa pink and moist.  Dentition intact and without obvious caries or gingival swelling.  Oropharynx without erythema, exudate, or swelling.  Neck: supple/nontender.  No LAD, mass, or TM.  Carotid pulses 2+ bilaterally, without bruits. CV: RRR, no m/r/g.   LUNGS: CTA bilat, nonlabored resps, good aeration in all lung fields. ABD: soft, NT, ND, BS normal.  No hepatospenomegaly or mass.  No bruits. EXT: no clubbing, cyanosis, or edema.  Musculoskeletal: no joint swelling, erythema, warmth, or tenderness.  ROM of all joints intact. Skin - no sores or suspicious rashes or color changes.  She has a mixed-pigment nevus about 5mm in diameter on right shoulder blade region.  Pertinent labs:  Lab Results  Component Value Date   WBC 4.9 08/23/2013   HGB 14.7 08/23/2013   HCT 42.8 08/23/2013   MCV 83.4 08/23/2013   PLT 230 08/23/2013   Lab Results  Component Value Date   CHOL 193 08/23/2013   HDL 55 08/23/2013   LDLCALC 110* 08/23/2013   LDLDIRECT 152.5 08/17/2012    TRIG 142 08/23/2013   CHOLHDL 3.5 08/23/2013     Chemistry      Component Value Date/Time   NA 137 08/23/2013 0939   K 4.6 08/23/2013 0939   CL 102 08/23/2013 0939   CO2 27 08/23/2013 0939   BUN 14 08/23/2013 0939   CREATININE 0.89 08/23/2013 0939   CREATININE 0.8 08/17/2012 0802      Component Value Date/Time   CALCIUM 9.4 08/23/2013 0939   ALKPHOS 91 08/23/2013 0939   AST 18 08/23/2013 0939   ALT 15 08/23/2013 0939   BILITOT 0.5 08/23/2013 0939     Lab Results  Component Value Date   TSH 1.435 08/23/2013    ASSESSMENT AND PLAN:   Health  maintenance examination Reviewed age and gender appropriate health maintenance issues (prudent diet, regular exercise, health risks of tobacco and excessive alcohol, use of seatbelts, fire alarms in home, use of sunscreen).  Also reviewed age and gender appropriate health screening as well as vaccine recommendations. She is UTD on vaccines, including flu for this season. She is set up for repeat colonoscopy with Dr. Kinnie Scales for her hx of adenomatous colon polyps. She will be arranging annual mammogram through [facility] soon, as well as getting in to see her dermatologist for a complete skin check + discussion of excision of the atypical-appearing nevus on upper back. All labs reviewed today: very good.  We reviewed Calcium and vit D supplement recommendations for women and she said she'll try to get into routine of taking these. Continue all current med regimen.   An After Visit Summary was printed and given to the patient.  FOLLOW UP:  Return in about 6 months (around 03/04/2014) for f/u anxiety, etc.

## 2013-10-29 ENCOUNTER — Encounter: Payer: Self-pay | Admitting: Family Medicine

## 2013-12-02 LAB — HM MAMMOGRAPHY

## 2013-12-13 ENCOUNTER — Other Ambulatory Visit: Payer: Self-pay | Admitting: Family Medicine

## 2013-12-21 ENCOUNTER — Other Ambulatory Visit: Payer: Self-pay | Admitting: Family Medicine

## 2014-03-04 ENCOUNTER — Ambulatory Visit (INDEPENDENT_AMBULATORY_CARE_PROVIDER_SITE_OTHER): Payer: BC Managed Care – PPO | Admitting: Family Medicine

## 2014-03-04 ENCOUNTER — Encounter: Payer: Self-pay | Admitting: Family Medicine

## 2014-03-04 VITALS — BP 108/76 | HR 78 | Temp 98.8°F | Resp 18 | Ht 68.0 in | Wt 172.0 lb

## 2014-03-04 DIAGNOSIS — K219 Gastro-esophageal reflux disease without esophagitis: Secondary | ICD-10-CM

## 2014-03-04 DIAGNOSIS — F341 Dysthymic disorder: Secondary | ICD-10-CM

## 2014-03-04 DIAGNOSIS — F418 Other specified anxiety disorders: Secondary | ICD-10-CM

## 2014-03-04 DIAGNOSIS — E785 Hyperlipidemia, unspecified: Secondary | ICD-10-CM

## 2014-03-04 NOTE — Progress Notes (Signed)
OFFICE NOTE  03/04/2014  CC:  Chief Complaint  Patient presents with  . Follow-up     HPI: Patient is a 58 y.o. Caucasian female who is here for 6 mo f/u depression/anxiety w/ hx of panic, hyperlipidemia, GERD. Doing well, very busy with work, not exercising much.  Diet is good, takes mevacor qod w/out side effects. Anxiety well controlled.  Mood stable.  GERD diet + zantac daily, every 1-2 months she has to do a 2 wk course of nexium--basically controls this problem pretty well in this manner for the last few years. She has had this pattern/severity of reflux for a few years and asks about anything that needs to be monitored.   Pertinent PMH:  Past medical, surgical, social, and family history reviewed and no changes are noted since last office visit.  MEDS:  AsA 81mg  qd, Wellbutrin XL 150mg  qd, Mevacor 40 mg qod, nexium 20mg  qd prn, zantac 150mg  qd  PE: Blood pressure 108/76, pulse 78, temperature 98.8 F (37.1 C), temperature source Temporal, resp. rate 18, height 5\' 8"  (1.727 m), weight 172 lb (78.019 kg), SpO2 95.00%. Gen: Alert, well appearing.  Patient is oriented to person, place, time, and situation. AFFECT: pleasant, lucid thought and speech.  LAB: none today  IMPRESSION AND PLAN:  1) Dep/Anx/panic: The current medical regimen is effective;  continue present plan and medications.  2) Hyperlipidemia: The current medical regimen is effective;  continue present plan and medications. Increase exercise.  3) GERD.  Controlled with diet, daily H2 blocker, plus periodic use of 2 wk course of nexium---this condition has been at this level for several years.  I think it is time for GI referral for consideration of screening for Barrett's esophagus. She agrees and we'll make this referral at her next f/u here for CPE in 6 mo.  An After Visit Summary was printed and given to the patient.  FOLLOW UP: 50mo, cPE with fasting labs the week prior.

## 2014-03-04 NOTE — Progress Notes (Signed)
Pre visit review using our clinic review tool, if applicable. No additional management support is needed unless otherwise documented below in the visit note. 

## 2014-04-14 ENCOUNTER — Ambulatory Visit (INDEPENDENT_AMBULATORY_CARE_PROVIDER_SITE_OTHER): Payer: BC Managed Care – PPO | Admitting: Family Medicine

## 2014-04-14 ENCOUNTER — Encounter: Payer: Self-pay | Admitting: Family Medicine

## 2014-04-14 VITALS — BP 111/77 | HR 69 | Temp 99.0°F | Resp 18 | Ht 68.0 in | Wt 172.0 lb

## 2014-04-14 DIAGNOSIS — F411 Generalized anxiety disorder: Secondary | ICD-10-CM

## 2014-04-14 MED ORDER — DULOXETINE HCL 30 MG PO CPEP: 30.0000 mg | ORAL_CAPSULE | Freq: Every day | ORAL | Status: DC

## 2014-04-14 MED ORDER — BUPROPION HCL 75 MG PO TABS: ORAL_TABLET | ORAL | Status: DC

## 2014-04-14 NOTE — Progress Notes (Signed)
OFFICE NOTE  04/14/2014  CC:  Chief Complaint  Patient presents with  . Anxiety    doesn't feel like her wellbutrin no longer works.    HPI: Patient is a 57 y.o. Caucasian female who is here for discussion of anxiety. Approx 10d of worsened chronic worry, irritability, anhedonia, some tremulousness sometimes, sometimes a central chest/upper abdomen "weight" that is random.  Keyed up.   Compliant with wellbutrin XL 150mg  qd, has taken 2 tabs on certain days and it has helped a little. Seems to have the sx's more when she has the distraction of work.  Pertinent PMH:  Past medical, surgical, social, and family history reviewed and no changes are noted since last office visit.  MEDS: takes nexium prn Outpatient Prescriptions Prior to Visit  Medication Sig Dispense Refill  . aspirin EC 81 MG tablet Take 81 mg by mouth 3 (three) times a week.       Marland Kitchen b complex vitamins tablet Take 1 tablet by mouth daily.      Marland Kitchen buPROPion (WELLBUTRIN XL) 150 MG 24 hr tablet TAKE 1 TABLET (150 MG TOTAL) BY MOUTH DAILY.  30 tablet  3  . esomeprazole (NEXIUM) 20 MG capsule Take 20 mg by mouth daily before breakfast.      . lovastatin (MEVACOR) 40 MG tablet Take 1 tablet (40 mg total) by mouth at bedtime.  30 tablet  3  . zoster vaccine live, PF, (ZOSTAVAX) 98921 UNT/0.65ML injection Inject 19,400 Units into the skin once.  1 vial  0   No facility-administered medications prior to visit.    PE: Blood pressure 111/77, pulse 69, temperature 99 F (37.2 C), temperature source Temporal, resp. rate 18, height 5\' 8"  (1.727 m), weight 172 lb (78.019 kg), SpO2 98.00%. Pt examined with Jacklynn Ganong, CMA as chaperone. Gen: Alert, well appearing.  Patient is oriented to person, place, time, and situation. JHE:RDEY: no injection, icteris, swelling, or exudate.  EOMI, PERRLA. Mouth: lips without lesion/swelling.  Oral mucosa pink and moist. Oropharynx without erythema, exudate, or swelling.  CV: RRR, no m/r/g.    LUNGS: CTA bilat, nonlabored resps, good aeration in all lung fields. ABD: soft, NT, ND, BS normal.  No hepatospenomegaly or mass.  No bruits. EXT: no clubbing, cyanosis, or edema.  Chest: TTP in lower sternum   IMPRESSION AND PLAN:  GAD, with some physical symptoms.  Reassured. Ween off of wellbutrin and start cymbalta 30mg  qd at the same time.  Therapeutic expectations and side effect profile of medication discussed today.  Patient's questions answered.  Instructions: bupropion 75mg  qd x 5d, then 1/2 tab po qd x 6d, then stop.  FOLLOW UP: 3-4 wks.

## 2014-04-14 NOTE — Progress Notes (Signed)
Pre visit review using our clinic review tool, if applicable. No additional management support is needed unless otherwise documented below in the visit note. 

## 2014-04-15 ENCOUNTER — Other Ambulatory Visit: Payer: Self-pay | Admitting: Family Medicine

## 2014-04-15 ENCOUNTER — Telehealth: Payer: Self-pay | Admitting: Family Medicine

## 2014-04-15 MED ORDER — CLONAZEPAM 1 MG PO TABS: ORAL_TABLET | ORAL | Status: DC

## 2014-04-15 NOTE — Telephone Encounter (Signed)
Patient states she is having a panic attack and has been since 1 am this morning.  Please contact her at your earliest convenience.

## 2014-04-15 NOTE — Telephone Encounter (Signed)
Pt with panic attacks after starting cymbalta 30mg  last night (and switching from welbutrin XL to plain bupropion 75mg  as per her weening schedule for this med).  She is apprehensive about our plan now, wants to go back to wellbutrin XL 150mg  qd like she was before yesterday and "ride this out". I sent in rx for clonazepam 1mg  to take 1/2-1 tab bid prn severe anxiety.

## 2014-05-04 ENCOUNTER — Other Ambulatory Visit: Payer: Self-pay | Admitting: Family Medicine

## 2014-05-12 ENCOUNTER — Ambulatory Visit: Payer: BC Managed Care – PPO | Admitting: Family Medicine

## 2014-05-12 ENCOUNTER — Telehealth: Payer: Self-pay | Admitting: Family Medicine

## 2014-05-12 NOTE — Telephone Encounter (Signed)
Patient states she is doing much better. She had to cancel today's fu visit due to her work schedule. Does Dr. Anitra Lauth need her to reschedule since she is doing better?

## 2014-05-12 NOTE — Telephone Encounter (Signed)
An appt in 5-6 mo for a physical is fine.-thx

## 2014-05-12 NOTE — Telephone Encounter (Signed)
Left detailed message.   

## 2014-05-12 NOTE — Telephone Encounter (Signed)
Please advise 

## 2014-05-30 ENCOUNTER — Other Ambulatory Visit: Payer: Self-pay | Admitting: Family Medicine

## 2014-06-21 ENCOUNTER — Other Ambulatory Visit: Payer: Self-pay | Admitting: Family Medicine

## 2014-07-11 ENCOUNTER — Other Ambulatory Visit: Payer: Self-pay

## 2014-07-11 MED ORDER — BUPROPION HCL ER (XL) 150 MG PO TB24: ORAL_TABLET | ORAL | Status: DC

## 2014-07-11 NOTE — Telephone Encounter (Signed)
Pt called to make an appt and needed Rx for Cymbalta sent in. 30 day supply sent

## 2014-07-21 ENCOUNTER — Other Ambulatory Visit (INDEPENDENT_AMBULATORY_CARE_PROVIDER_SITE_OTHER): Payer: BC Managed Care – PPO

## 2014-07-21 DIAGNOSIS — Z0000 Encounter for general adult medical examination without abnormal findings: Secondary | ICD-10-CM

## 2014-07-21 LAB — COMPREHENSIVE METABOLIC PANEL
ALK PHOS: 83 U/L (ref 39–117)
ALT: 15 U/L (ref 0–35)
AST: 21 U/L (ref 0–37)
Albumin: 4.3 g/dL (ref 3.5–5.2)
BILIRUBIN TOTAL: 0.5 mg/dL (ref 0.2–1.2)
BUN: 15 mg/dL (ref 6–23)
CO2: 27 mEq/L (ref 19–32)
CREATININE: 0.91 mg/dL (ref 0.50–1.10)
Calcium: 9.2 mg/dL (ref 8.4–10.5)
Chloride: 105 mEq/L (ref 96–112)
Glucose, Bld: 81 mg/dL (ref 70–99)
Potassium: 4.7 mEq/L (ref 3.5–5.3)
Sodium: 138 mEq/L (ref 135–145)
Total Protein: 6.7 g/dL (ref 6.0–8.3)

## 2014-07-21 LAB — CBC WITH DIFFERENTIAL/PLATELET
Basophils Absolute: 0 10*3/uL (ref 0.0–0.1)
Basophils Relative: 1 % (ref 0–1)
Eosinophils Absolute: 0.2 10*3/uL (ref 0.0–0.7)
Eosinophils Relative: 4 % (ref 0–5)
HEMATOCRIT: 40.6 % (ref 36.0–46.0)
HEMOGLOBIN: 14.1 g/dL (ref 12.0–15.0)
LYMPHS PCT: 30 % (ref 12–46)
Lymphs Abs: 1.5 10*3/uL (ref 0.7–4.0)
MCH: 29 pg (ref 26.0–34.0)
MCHC: 34.7 g/dL (ref 30.0–36.0)
MCV: 83.4 fL (ref 78.0–100.0)
MONO ABS: 0.4 10*3/uL (ref 0.1–1.0)
Monocytes Relative: 9 % (ref 3–12)
NEUTROS ABS: 2.7 10*3/uL (ref 1.7–7.7)
Neutrophils Relative %: 56 % (ref 43–77)
Platelets: 205 10*3/uL (ref 150–400)
RBC: 4.87 MIL/uL (ref 3.87–5.11)
RDW: 13.2 % (ref 11.5–15.5)
WBC: 4.9 10*3/uL (ref 4.0–10.5)

## 2014-07-21 LAB — LIPID PANEL
CHOL/HDL RATIO: 3 ratio
CHOLESTEROL: 158 mg/dL (ref 0–200)
HDL: 53 mg/dL (ref >39)
LDL Cholesterol: 90 mg/dL (ref 0–99)
TRIGLYCERIDES: 77 mg/dL (ref <150)
VLDL: 15 mg/dL (ref 0–40)

## 2014-07-21 LAB — TSH: TSH: 2.496 u[IU]/mL (ref 0.350–4.500)

## 2014-07-22 ENCOUNTER — Encounter: Payer: Self-pay | Admitting: Family Medicine

## 2014-07-22 ENCOUNTER — Ambulatory Visit (INDEPENDENT_AMBULATORY_CARE_PROVIDER_SITE_OTHER): Payer: BC Managed Care – PPO | Admitting: Family Medicine

## 2014-07-22 VITALS — BP 103/71 | HR 62 | Temp 98.4°F | Resp 18 | Ht 68.0 in | Wt 173.0 lb

## 2014-07-22 DIAGNOSIS — Z0000 Encounter for general adult medical examination without abnormal findings: Secondary | ICD-10-CM

## 2014-07-22 NOTE — Progress Notes (Signed)
Office Note 07/28/2014  CC:  Chief Complaint  Patient presents with  . Annual Exam   HPI:  Tanya Daniels is a 58 y.o. White female who is here for CPE. Reviewed recent fasting labs in detail with pt today: all normal. Exercising and dieting with more consistency lately. Not eating dairy, bread, sweets, and not drinking alcohol anymore. Eats nondairy foods that are rich in calcium and iron.  Lots of fruits and veggies.  GERD gone since cutting out dairy! Anxiety MUCH improved since changing to the above diet!   Past Medical History  Diagnosis Date  . Depression   . Hyperlipidemia   . Hx of adenomatous colonic polyps 2002, 2009, 2015    TCS nl 2005 and 2009 (except oozing internal hemorrhoid tx'd w/ sclerotherapy 2009)) Dr Earlean Shawl.  Polypectomy x 1 2015  . Palpitations 2008    holter neg  . Atypical chest pain 06/06/2012  . GERD (gastroesophageal reflux disease) 02/23/2012  . Panic disorder without agoraphobia 09/29/2010  . Depression with anxiety     Past Surgical History  Procedure Laterality Date  . Abdominal hysterectomy  1995    fibroids/ menorrhagia (ovaries remain)  . Cesarean section  1993  . Appendectomy  1975  . Cardiovascular stress test      exercise nuclear study 06/19/12 normal perfusion and normal function.  . Colonoscopy w/ polypectomy  multiple    Most recent 2015--polypectomy x 1.  Recall 5 yrs.    Family History  Problem Relation Age of Onset  . Coronary artery disease Father 38  . Cancer Father     Possible lung cancer  . Diabetes Father     type 2  . Depression Father   . Heart attack Brother 49  . Hyperlipidemia Brother     History   Social History  . Marital Status: Married    Spouse Name: N/A    Number of Children: N/A  . Years of Education: N/A   Occupational History  . Not on file.   Social History Main Topics  . Smoking status: Never Smoker   . Smokeless tobacco: Never Used  . Alcohol Use: No  . Drug Use: No  .  Sexual Activity: Not on file   Other Topics Concern  . Not on file   Social History Narrative   Occupation: Programmer, applications Orthoptist) for Human resources officer.   Married, 2 sons, (one goes to [employer]her Advice worker).   Never Smoked   Alcohol use-no   Drug use-no   Regular exercise-yes                Outpatient Prescriptions Prior to Visit  Medication Sig Dispense Refill  . aspirin EC 81 MG tablet Take 81 mg by mouth 3 (three) times a week.       Marland Kitchen b complex vitamins tablet Take 1 tablet by mouth daily.      Marland Kitchen buPROPion (WELLBUTRIN XL) 150 MG 24 hr tablet TAKE 1 TABLET (150 MG TOTAL) BY MOUTH DAILY.  30 tablet  0  . clonazePAM (KLONOPIN) 1 MG tablet 1/2-1 tab po bid prn anxiety  30 tablet  1  . lovastatin (MEVACOR) 40 MG tablet TAKE 1 TABLET (40 MG TOTAL) BY MOUTH AT BEDTIME.  30 tablet  3  . esomeprazole (NEXIUM) 20 MG capsule Take 20 mg by mouth daily before breakfast.       No facility-administered medications prior to visit.    Allergies  Allergen Reactions  . Amoxicillin  REACTION: Stomach upset  . Erythromycin     REACTION: Stomach upset    ROS Review of Systems  Constitutional: Negative for fever, chills, appetite change and fatigue.  HENT: Negative for congestion, dental problem, ear pain and sore throat.   Eyes: Negative for discharge, redness and visual disturbance.  Respiratory: Negative for cough, chest tightness, shortness of breath and wheezing.   Cardiovascular: Negative for chest pain, palpitations and leg swelling.  Gastrointestinal: Negative for nausea, vomiting, abdominal pain, diarrhea and blood in stool.  Genitourinary: Negative for dysuria, urgency, frequency, hematuria, flank pain and difficulty urinating.  Musculoskeletal: Negative for arthralgias, back pain, joint swelling, myalgias and neck stiffness.  Skin: Negative for pallor and rash.  Neurological: Negative for dizziness, speech difficulty, weakness and headaches.  Hematological: Negative  for adenopathy. Does not bruise/bleed easily.  Psychiatric/Behavioral: Negative for confusion and sleep disturbance. The patient is not nervous/anxious.     PE; Blood pressure 103/71, pulse 62, temperature 98.4 F (36.9 C), temperature source Temporal, resp. rate 18, height 5\' 8"  (1.727 m), weight 173 lb (78.472 kg), SpO2 98.00%. Gen: Alert, well appearing.  Patient is oriented to person, place, time, and situation. AFFECT: pleasant, lucid thought and speech. ENT: Ears: EACs clear, normal epithelium.  TMs with good light reflex and landmarks bilaterally.  Eyes: no injection, icteris, swelling, or exudate.  EOMI, PERRLA. Nose: no drainage or turbinate edema/swelling.  No injection or focal lesion.  Mouth: lips without lesion/swelling.  Oral mucosa pink and moist.  Dentition intact and without obvious caries or gingival swelling.  Oropharynx without erythema, exudate, or swelling.  Neck: supple/nontender.  No LAD, mass, or TM.  Carotid pulses 2+ bilaterally, without bruits. CV: RRR, no m/r/g.   LUNGS: CTA bilat, nonlabored resps, good aeration in all lung fields. ABD: soft, NT, ND, BS normal.  No hepatospenomegaly or mass.  No bruits. EXT: no clubbing, cyanosis, or edema.  Musculoskeletal: no joint swelling, erythema, warmth, or tenderness.  ROM of all joints intact. Skin - no sores or suspicious lesions or rashes or color changes  Pertinent labs:  Lab Results  Component Value Date   WBC 4.9 07/21/2014   HGB 14.1 07/21/2014   HCT 40.6 07/21/2014   MCV 83.4 07/21/2014   PLT 205 07/21/2014     Chemistry      Component Value Date/Time   NA 138 07/21/2014 0923   K 4.7 07/21/2014 0923   CL 105 07/21/2014 0923   CO2 27 07/21/2014 0923   BUN 15 07/21/2014 0923   CREATININE 0.91 07/21/2014 0923   CREATININE 0.8 08/17/2012 0802      Component Value Date/Time   CALCIUM 9.2 07/21/2014 0923   ALKPHOS 83 07/21/2014 0923   AST 21 07/21/2014 0923   ALT 15 07/21/2014 0923   BILITOT 0.5  07/21/2014 0923     Lab Results  Component Value Date   CHOL 158 07/21/2014   HDL 53 07/21/2014   LDLCALC 90 07/21/2014   LDLDIRECT 152.5 08/17/2012   TRIG 77 07/21/2014   CHOLHDL 3.0 07/21/2014   Lab Results  Component Value Date   TSH 2.496 07/21/2014   ASSESSMENT AND PLAN:   Health maintenance examination Reviewed age and gender appropriate health maintenance issues (prudent diet, regular exercise, health risks of tobacco and excessive alcohol, use of seatbelts, fire alarms in home, use of sunscreen).  Also reviewed age and gender appropriate health screening as well as vaccine recommendations.  Colon cancer screening UTD. HP labs reviewed: all good. GYN care/cerv  ca and breast ca screening via her GYN, Dr. Paula Compton.   An After Visit Summary was printed and given to the patient.  FOLLOW UP:  Return in about 6 months (around 01/21/2015) for routine chronic illness f/u.

## 2014-07-28 NOTE — Assessment & Plan Note (Addendum)
Reviewed age and gender appropriate health maintenance issues (prudent diet, regular exercise, health risks of tobacco and excessive alcohol, use of seatbelts, fire alarms in home, use of sunscreen).  Also reviewed age and gender appropriate health screening as well as vaccine recommendations.  Colon cancer screening UTD. HP labs reviewed: all good. GYN care/cerv ca and breast ca screening via her GYN, Dr. Paula Compton.

## 2014-08-06 ENCOUNTER — Other Ambulatory Visit: Payer: Self-pay | Admitting: Family Medicine

## 2014-08-06 MED ORDER — BUPROPION HCL ER (XL) 150 MG PO TB24: ORAL_TABLET | ORAL | Status: DC

## 2014-09-04 ENCOUNTER — Other Ambulatory Visit: Payer: BC Managed Care – PPO

## 2014-09-10 ENCOUNTER — Encounter: Payer: BC Managed Care – PPO | Admitting: Family Medicine

## 2015-01-19 ENCOUNTER — Other Ambulatory Visit: Payer: Self-pay | Admitting: Family Medicine

## 2015-01-19 MED ORDER — BUPROPION HCL ER (XL) 150 MG PO TB24: ORAL_TABLET | ORAL | Status: DC

## 2015-01-21 ENCOUNTER — Ambulatory Visit: Payer: BC Managed Care – PPO | Admitting: Family Medicine

## 2015-04-30 ENCOUNTER — Encounter: Payer: Self-pay | Admitting: *Deleted

## 2015-05-04 ENCOUNTER — Telehealth: Payer: Self-pay | Admitting: Family Medicine

## 2015-05-04 NOTE — Telephone Encounter (Signed)
Pt called she is on vacation and was bitten by a deer fly. [facility] and was given Dox[facility]in on Sat. Now she can not be out in the sun. Can she stop taking this medication, she says it looks better. Please call her at [phone].

## 2015-05-04 NOTE — Telephone Encounter (Signed)
Spoke to pt she stated that she seen then insect that bit her and it was a "Psychologist, [employer] probation services . She stated that [facility] because the area turned really red, swollen, feverish and painful. She stated that they gave her a Rx for Doxycycline but did not tell her to avoid the sun. She states the is on vacation until Saturday 05/10/15 and is unable to leave the house because her skin feels like it is on fire even with 70SPF sunscreen. She states that the area is no longer feverish or painful and is smaller in size and more of a purplish color. She wants to know if she can stop the Doxycycline or can another Antibiotic be sent in. Per Dr. Anitra Lauth okay for her to stop Doxycyline if symptoms have improved but should still avoid direct sunlight for the next 2 days and wear sunscreen if she is going to be outside. Pt advised and voiced understanding.

## 2015-06-02 ENCOUNTER — Encounter: Payer: Self-pay | Admitting: Family Medicine

## 2015-06-02 ENCOUNTER — Ambulatory Visit (INDEPENDENT_AMBULATORY_CARE_PROVIDER_SITE_OTHER): Payer: BLUE CROSS/BLUE SHIELD | Admitting: Family Medicine

## 2015-06-02 VITALS — BP 114/77 | HR 65 | Temp 97.7°F | Resp 16 | Ht 68.0 in | Wt 177.0 lb

## 2015-06-02 DIAGNOSIS — F418 Other specified anxiety disorders: Secondary | ICD-10-CM | POA: Diagnosis not present

## 2015-06-02 DIAGNOSIS — E785 Hyperlipidemia, unspecified: Secondary | ICD-10-CM

## 2015-06-02 MED ORDER — CLONAZEPAM 1 MG PO TABS: ORAL_TABLET | ORAL | Status: DC

## 2015-06-02 NOTE — Progress Notes (Signed)
OFFICE VISIT  06/02/2015   CC:  Chief Complaint  Patient presents with  . Follow-up     HPI:    Patient is a 59 y.o. Caucasian female who presents for f/u for depression and anxiety and hyperlipidemia. I last saw her 10 mo ago for her annual CPE.   She says she is doing well. Mood is stable, anxiety stable--work stress is her biggest concern.  She feels like she is handling it fairly well. Rarely using clonazepam.  Taking wellbutrin 3 days a week.  Taking lovastatin 2-3 times per week.  She is eating lower carb/lower fat diet consistently. Walks 2 miles every morning.   Past Medical History  Diagnosis Date  . Depression   . Hyperlipidemia   . Hx of adenomatous colonic polyps 2002, 2009, 2015    TCS nl 2005 and 2009 (except oozing internal hemorrhoid tx'd w/ sclerotherapy 2009)) Dr Earlean Shawl.  Polypectomy x 1 2015  . Palpitations 2008    holter neg  . Atypical chest pain 06/06/2012  . GERD (gastroesophageal reflux disease) 02/23/2012  . Panic disorder without agoraphobia 09/29/2010  . Depression with anxiety     Past Surgical History  Procedure Laterality Date  . Abdominal hysterectomy  1995    fibroids/ menorrhagia (ovaries remain)  . Cesarean section  1993  . Appendectomy  1975  . Cardiovascular stress test      exercise nuclear study 06/19/12 normal perfusion and normal function.  . Colonoscopy w/ polypectomy  multiple    Most recent 2015--polypectomy x 1.  Recall 5 yrs.    Outpatient Prescriptions Prior to Visit  Medication Sig Dispense Refill  . aspirin EC 81 MG tablet Take 81 mg by mouth 3 (three) times a week.     Marland Kitchen buPROPion (WELLBUTRIN XL) 150 MG 24 hr tablet TAKE 1 TABLET (150 MG TOTAL) BY MOUTH DAILY. 90 tablet 3  . lovastatin (MEVACOR) 40 MG tablet TAKE 1 TABLET (40 MG TOTAL) BY MOUTH AT BEDTIME. 30 tablet 3  . clonazePAM (KLONOPIN) 1 MG tablet 1/2-1 tab po bid prn anxiety 30 tablet 1  . b complex vitamins tablet Take 1 tablet by mouth daily.    Marland Kitchen  esomeprazole (NEXIUM) 20 MG capsule Take 20 mg by mouth daily before breakfast.     No facility-administered medications prior to visit.    Allergies  Allergen Reactions  . Amoxicillin     REACTION: Stomach upset  . Erythromycin     REACTION: Stomach upset    ROS As per HPI  PE: Blood pressure 114/77, pulse 65, temperature 97.7 F (36.5 C), temperature source Oral, resp. rate 16, height 5\' 8"  (1.727 m), weight 177 lb (80.287 kg), SpO2 95 %. Gen: Alert, well appearing.  Patient is oriented to person, place, time, and situation. AFFECT: pleasant, lucid thought and speech. No further exam today  LABS:  Lab Results  Component Value Date   TSH 2.496 07/21/2014   Lab Results  Component Value Date   WBC 4.9 07/21/2014   HGB 14.1 07/21/2014   HCT 40.6 07/21/2014   MCV 83.4 07/21/2014   PLT 205 07/21/2014   Lab Results  Component Value Date   CREATININE 0.91 07/21/2014   BUN 15 07/21/2014   NA 138 07/21/2014   K 4.7 07/21/2014   CL 105 07/21/2014   CO2 27 07/21/2014   Lab Results  Component Value Date   ALT 15 07/21/2014   AST 21 07/21/2014   ALKPHOS 83 07/21/2014   BILITOT 0.5  07/21/2014   Lab Results  Component Value Date   CHOL 158 07/21/2014   Lab Results  Component Value Date   HDL 53 07/21/2014   Lab Results  Component Value Date   LDLCALC 90 07/21/2014   Lab Results  Component Value Date   TRIG 77 07/21/2014   Lab Results  Component Value Date   CHOLHDL 3.0 07/21/2014   IMPRESSION AND PLAN:  1) Anxiety and depression: The current medical regimen is effective;  continue present plan and medications. RF of clonaz 1mg , 1/2-1 bid prn, #30, RF x 1.  2) Hyperlipidemia: tolerating statin.  Diet and activity are good. Recheck FLP and transaminases at CPE coming up in a few months.  An After Visit Summary was printed and given to the patient.   FOLLOW UP: Return for 2-3 mo for fasting CPE.

## 2015-06-02 NOTE — Progress Notes (Signed)
Pre visit review using our clinic review tool, if applicable. No additional management support is needed unless otherwise documented below in the visit note. 

## 2015-07-13 ENCOUNTER — Other Ambulatory Visit: Payer: Self-pay | Admitting: *Deleted

## 2015-07-13 MED ORDER — LOVASTATIN 40 MG PO TABS: ORAL_TABLET | ORAL | Status: DC

## 2015-07-13 NOTE — Telephone Encounter (Signed)
RF request for lovastatin LOV: 06/02/15 Next ov: 08/25/15 Last written: unknown

## 2015-08-04 ENCOUNTER — Other Ambulatory Visit: Payer: Self-pay | Admitting: *Deleted

## 2015-08-04 MED ORDER — LOVASTATIN 40 MG PO TABS: ORAL_TABLET | ORAL | Status: DC

## 2015-08-04 MED ORDER — CLONAZEPAM 1 MG PO TABS: ORAL_TABLET | ORAL | Status: AC

## 2015-08-04 NOTE — Telephone Encounter (Addendum)
RF request for clonazepam LOV: 06/02/15 Next ov: 08/25/15 Last written: 06/02/15 #30 w/ 1RR Please advise. Thanks.  --------------------------------------------------- RF request for lovastatin LOV: 06/02/15 Next ov: 08/25/15 Last written: 06/02/15 #30 w/ 11RF Rx has already been sent.

## 2015-08-04 NOTE — Telephone Encounter (Signed)
Rx faxed

## 2015-08-25 ENCOUNTER — Ambulatory Visit (INDEPENDENT_AMBULATORY_CARE_PROVIDER_SITE_OTHER): Payer: BLUE CROSS/BLUE SHIELD | Admitting: Family Medicine

## 2015-08-25 ENCOUNTER — Encounter: Payer: Self-pay | Admitting: Family Medicine

## 2015-08-25 VITALS — BP 101/70 | HR 64 | Temp 97.8°F | Resp 16 | Ht 68.5 in | Wt 180.0 lb

## 2015-08-25 DIAGNOSIS — Z0000 Encounter for general adult medical examination without abnormal findings: Secondary | ICD-10-CM

## 2015-08-25 LAB — LIPID PANEL
CHOL/HDL RATIO: 4
Cholesterol: 234 mg/dL — ABNORMAL HIGH (ref 0–200)
HDL: 64 mg/dL (ref >39.00)
LDL Cholesterol: 151 mg/dL — ABNORMAL HIGH (ref 0–99)
NONHDL: 169.68
Triglycerides: 91 mg/dL (ref 0.0–149.0)
VLDL: 18.2 mg/dL (ref 0.0–40.0)

## 2015-08-25 LAB — CBC WITH DIFFERENTIAL/PLATELET
BASOS PCT: 0.8 % (ref 0.0–3.0)
Basophils Absolute: 0 10*3/uL (ref 0.0–0.1)
Eosinophils Absolute: 0.2 10*3/uL (ref 0.0–0.7)
Eosinophils Relative: 4 % (ref 0.0–5.0)
HCT: 43.5 % (ref 36.0–46.0)
Hemoglobin: 14.4 g/dL (ref 12.0–15.0)
LYMPHS ABS: 1.6 10*3/uL (ref 0.7–4.0)
Lymphocytes Relative: 27.1 % (ref 12.0–46.0)
MCHC: 33.2 g/dL (ref 30.0–36.0)
MCV: 86 fl (ref 78.0–100.0)
MONOS PCT: 9 % (ref 3.0–12.0)
Monocytes Absolute: 0.5 10*3/uL (ref 0.1–1.0)
NEUTROS ABS: 3.5 10*3/uL (ref 1.4–7.7)
NEUTROS PCT: 59.1 % (ref 43.0–77.0)
PLATELETS: 201 10*3/uL (ref 150.0–400.0)
RBC: 5.06 Mil/uL (ref 3.87–5.11)
RDW: 13.5 % (ref 11.5–15.5)
WBC: 5.9 10*3/uL (ref 4.0–10.5)

## 2015-08-25 LAB — COMPREHENSIVE METABOLIC PANEL
ALT: 25 U/L (ref 0–35)
AST: 25 U/L (ref 0–37)
Albumin: 4.2 g/dL (ref 3.5–5.2)
Alkaline Phosphatase: 94 U/L (ref 39–117)
BILIRUBIN TOTAL: 0.7 mg/dL (ref 0.2–1.2)
BUN: 18 mg/dL (ref 6–23)
CO2: 27 mEq/L (ref 19–32)
Calcium: 9.4 mg/dL (ref 8.4–10.5)
Chloride: 106 mEq/L (ref 96–112)
Creatinine, Ser: 0.84 mg/dL (ref 0.40–1.20)
GFR: 73.76 mL/min (ref >60.00)
GLUCOSE: 96 mg/dL (ref 70–99)
POTASSIUM: 4.7 meq/L (ref 3.5–5.1)
Sodium: 138 mEq/L (ref 135–145)
TOTAL PROTEIN: 6.9 g/dL (ref 6.0–8.3)

## 2015-08-25 LAB — TSH: TSH: 3.09 u[IU]/mL (ref 0.35–4.50)

## 2015-08-25 NOTE — Progress Notes (Signed)
Office Note 08/25/2015  CC:  Chief Complaint  Patient presents with  . Annual Exam    Pt is fasting.     HPI:  Tanya Daniels is a 59 y.o. White female who is here for annual health maintenance exam. Has weened herself off the wellbutrin totally and feels well. Takes 1/4 of clonaz per day and says this helps. Takes lovastatin 3 days a week.  No vision complaints: doesn't have to wear contacts anymore. Hearing: no complaints. Dental UTD.  Uses zantac daily, sometimes takes a round of nexium for a while: helps her gerd, has been doing this for a couple of years.  Working out on treadmill: 1 mile 5/7 d/week.   Past Medical History  Diagnosis Date  . Hyperlipidemia   . Hx of adenomatous colonic polyps 2002, 2009, 2015    TCS nl 2005 and 2009 (except oozing internal hemorrhoid tx'd w/ sclerotherapy 2009)) Dr Earlean Shawl.  Polypectomy x 1 2015  . Palpitations 2008    holter neg  . Atypical chest pain 06/06/2012  . GERD (gastroesophageal reflux disease) 02/23/2012  . Panic disorder without agoraphobia 09/29/2010  . Depression with anxiety     Past Surgical History  Procedure Laterality Date  . Abdominal hysterectomy  1995    fibroids/ menorrhagia (ovaries remain)  . Cesarean section  1993  . Appendectomy  1975  . Cardiovascular stress test      exercise nuclear study 06/19/12 normal perfusion and normal function.  . Colonoscopy w/ polypectomy  multiple    Most recent 2015--polypectomy x 1.  Recall 5 yrs.    Family History  Problem Relation Age of Onset  . Coronary artery disease Father 34  . Cancer Father     Possible lung cancer  . Diabetes Father     type 2  . Depression Father   . Heart attack Brother 72  . Hyperlipidemia Brother     Social History   Social History  . Marital Status: Married    Spouse Name: N/A  . Number of Children: N/A  . Years of Education: N/A   Occupational History  . Not on file.   Social History Main Topics  . Smoking status:  Never Smoker   . Smokeless tobacco: Never Used  . Alcohol Use: No  . Drug Use: No  . Sexual Activity: Not on file   Other Topics Concern  . Not on file   Social History Narrative   Occupation: Programmer, applications Orthoptist) for Human resources officer.   Married, 2 sons, (one goes to [employer]her Advice worker).   Never Smoked   Alcohol use-no   Drug use-no   Regular exercise-yes                Outpatient Prescriptions Prior to Visit  Medication Sig Dispense Refill  . aspirin EC 81 MG tablet Take 81 mg by mouth 3 (three) times a week.     . clonazePAM (KLONOPIN) 1 MG tablet 1/2-1 tab po bid prn anxiety 180 tablet 1  . lovastatin (MEVACOR) 40 MG tablet TAKE 1 TABLET (40 MG TOTAL) BY MOUTH AT BEDTIME. 90 tablet 3  . buPROPion (WELLBUTRIN XL) 150 MG 24 hr tablet TAKE 1 TABLET (150 MG TOTAL) BY MOUTH DAILY. (Patient not taking: Reported on 08/25/2015) 90 tablet 3   No facility-administered medications prior to visit.    Allergies  Allergen Reactions  . Amoxicillin     REACTION: Stomach upset  . Erythromycin     REACTION: Stomach  upset    ROS Review of Systems  Constitutional: Negative for fever, chills, appetite change and fatigue.  HENT: Negative for congestion, dental problem, ear pain and sore throat.   Eyes: Negative for discharge, redness and visual disturbance.  Respiratory: Negative for cough, chest tightness, shortness of breath and wheezing.   Cardiovascular: Negative for chest pain, palpitations and leg swelling.  Gastrointestinal: Negative for nausea, vomiting, abdominal pain, diarrhea and blood in stool.  Genitourinary: Negative for dysuria, urgency, frequency, hematuria, flank pain and difficulty urinating.  Musculoskeletal: Negative for myalgias, back pain, joint swelling, arthralgias and neck stiffness.  Skin: Negative for pallor and rash.  Neurological: Negative for dizziness, speech difficulty, weakness and headaches.  Hematological: Negative for adenopathy. Does not  bruise/bleed easily.  Psychiatric/Behavioral: Negative for confusion and sleep disturbance. The patient is not nervous/anxious.     PE; Blood pressure 101/70, pulse 64, temperature 97.8 F (36.6 C), temperature source Oral, resp. rate 16, height 5' 8.5" (1.74 m), weight 180 lb (81.647 kg), SpO2 96 %. Gen: Alert, well appearing.  Patient is oriented to person, place, time, and situation. AFFECT: pleasant, lucid thought and speech. ENT: Ears: EACs clear, normal epithelium.  TMs with good light reflex and landmarks bilaterally.  Eyes: no injection, icteris, swelling, or exudate.  EOMI, PERRLA. Nose: no drainage or turbinate edema/swelling.  No injection or focal lesion.  Mouth: lips without lesion/swelling.  Oral mucosa pink and moist.  Dentition intact and without obvious caries or gingival swelling.  Oropharynx without erythema, exudate, or swelling.  Neck: supple/nontender.  No LAD, mass, or TM.  Carotid pulses 2+ bilaterally, without bruits. CV: RRR, no m/r/g.   LUNGS: CTA bilat, nonlabored resps, good aeration in all lung fields. ABD: soft, NT, ND, BS normal.  No hepatospenomegaly or mass.  No bruits. EXT: no clubbing, cyanosis, or edema.  Musculoskeletal: no joint swelling, erythema, warmth, or tenderness.  ROM of all joints intact. Skin - no sores or suspicious lesions or rashes or color changes  Pertinent labs:  none  ASSESSMENT AND PLAN:   Health maintenance exam: Reviewed age and gender appropriate health maintenance issues (prudent diet, regular exercise, health risks of tobacco and excessive alcohol, use of seatbelts, fire alarms in home, use of sunscreen).  Also reviewed age and gender appropriate health screening as well as vaccine recommendations. Pt UTD on flu vaccine. Mammogram UTD. Hx of hysterectomy for nonmalignant reasons, no hx of abnormal paps: no indication for cerv ca screening. Colon ca screening UTD: next colonoscopy 4 yrs for hx of adenomatous polyps.  An After  Visit Summary was printed and given to the patient.   FOLLOW UP:  No Follow-up on file.

## 2015-08-25 NOTE — Progress Notes (Signed)
Pre visit review using our clinic review tool, if applicable. No additional management support is needed unless otherwise documented below in the visit note. 

## 2015-09-05 ENCOUNTER — Encounter: Payer: Self-pay | Admitting: Internal Medicine

## 2015-09-05 ENCOUNTER — Ambulatory Visit (INDEPENDENT_AMBULATORY_CARE_PROVIDER_SITE_OTHER): Payer: BLUE CROSS/BLUE SHIELD | Admitting: Internal Medicine

## 2015-09-05 VITALS — BP 104/74 | HR 62 | Temp 98.4°F | Ht 68.5 in | Wt 182.2 lb

## 2015-09-05 DIAGNOSIS — J0100 Acute maxillary sinusitis, unspecified: Secondary | ICD-10-CM | POA: Diagnosis not present

## 2015-09-05 MED ORDER — AMOXICILLIN-POT CLAVULANATE 875-125 MG PO TABS: 1.0000 | ORAL_TABLET | Freq: Two times a day (BID) | ORAL | Status: DC

## 2015-09-05 NOTE — Progress Notes (Signed)
Subjective:    Patient ID: Tanya Daniels, female    DOB: [birth year], 59 y.o.   MRN: [id number]  HPI She is here for an acute visit for a possible sinus infection.  Her symptoms started 4 days ago. She has severe sinus pain and pressure.  She has a cough that is occasionally productive of phlegm, body aches, nasal congestion, ear pain and headaches.   She has been taking nyquil, theraflu and mucinex.  She is using nasal spray. The medications help temporarily.   She typically gets a sinus infection at the end of a cold and she feels this is a typical sinus infection.   Medications and allergies reviewed with patient and updated if appropriate.  Patient Active Problem List   Diagnosis Date Noted  . Depression with anxiety 07/17/2013  . Health maintenance examination 08/22/2012  . GAD (generalized anxiety disorder) 02/23/2012  . GERD (gastroesophageal reflux disease) 02/23/2012  . ALLERGIC RHINITIS 10/23/2010  . COLONIC POLYPS, ADENOMATOUS 09/29/2010  . Hyperlipidemia 09/29/2010  . PANIC DISORDER WITHOUT AGORAPHOBIA 09/29/2010    Current Outpatient Prescriptions on File Prior to Visit  Medication Sig Dispense Refill  . aspirin EC 81 MG tablet Take 81 mg by mouth 3 (three) times a week.     . clonazePAM (KLONOPIN) 1 MG tablet 1/2-1 tab po bid prn anxiety 180 tablet 1  . lovastatin (MEVACOR) 40 MG tablet TAKE 1 TABLET (40 MG TOTAL) BY MOUTH AT BEDTIME. 90 tablet 3   No current facility-administered medications on file prior to visit.    Past Medical History  Diagnosis Date  . Hyperlipidemia   . Hx of adenomatous colonic polyps 2002, 2009, 2015    TCS nl 2005 and 2009 (except oozing internal hemorrhoid tx'd w/ sclerotherapy 2009)) Dr Earlean Shawl.  Polypectomy x 1 2015  . Palpitations 2008    holter neg  . Atypical chest pain 06/06/2012  . GERD (gastroesophageal reflux disease) 02/23/2012  . Panic disorder without agoraphobia 09/29/2010  . Depression with anxiety     Past  Surgical History  Procedure Laterality Date  . Abdominal hysterectomy  1995    fibroids/ menorrhagia (ovaries remain)  . Cesarean section  1993  . Appendectomy  1975  . Cardiovascular stress test      exercise nuclear study 06/19/12 normal perfusion and normal function.  . Colonoscopy w/ polypectomy  multiple    Most recent 2015--polypectomy x 1.  Recall 5 yrs.    Social History   Social History  . Marital Status: Married    Spouse Name: N/A  . Number of Children: N/A  . Years of Education: N/A   Social History Main Topics  . Smoking status: Never Smoker   . Smokeless tobacco: Never Used  . Alcohol Use: No  . Drug Use: No  . Sexual Activity: Not Asked   Other Topics Concern  . None   Social History Narrative   Occupation: Programmer, applications Orthoptist) for Human resources officer.   Married, 2 sons, (one goes to [employer]her Advice worker).   Never Smoked   Alcohol use-no   Drug use-no   Regular exercise-yes                Review of Systems  Constitutional: Negative for fever (feels warm at times).  HENT: Positive for congestion, ear pain, postnasal drip and sinus pressure. Negative for sore throat.   Respiratory: Positive for cough (some phlegm). Negative for shortness of breath and wheezing.   Gastrointestinal: Negative for  nausea, abdominal pain and diarrhea.  Neurological: Positive for headaches. Negative for dizziness and light-headedness.       Feels fuzzy       Objective:   Filed Vitals:   09/05/15 1113  BP: 104/74  Pulse: 62  Temp: 98.4 F (36.9 C)   Filed Weights   09/05/15 1113  Weight: 182 lb 4 oz (82.668 kg)   Body mass index is 27.3 kg/(m^2).   Physical Exam GENERAL APPEARANCE: Appears stated age, mildly ill appearing, NAD EYES: conjunctiva clear, no icterus HEENT: bilateral tympanic membranes and ear canals normal, oropharynx with mild erythema, nasal congestion, no thyromegaly, trachea midline, no cervical or supraclavicular  lymphadenopathy LUNGS: Clear to auscultation without wheeze or crackles, unlabored breathing, good air entry bilaterally HEART: Normal S1,S2 without murmurs EXTREMITIES: Without clubbing, cyanosis, or edema        Assessment & Plan:   Sinus infection Acute, but concerning for a bacterial infection and it fits her history of prior sinus infections Will start augmentin -- she has taken this in the past couple of years and has done well with it (she has had stomach up in the past which is why it is in her allergy list) Continue otc medications Increase rest and fluids  Call pcp on Monday if no improvement

## 2015-09-05 NOTE — Patient Instructions (Signed)
An antibiotic was sent to your pharmacy.  Please continue the over the counter medications.  Increase your rest and fluids.  Sinusitis, Adult Sinusitis is redness, soreness, and inflammation of the paranasal sinuses. Paranasal sinuses are air pockets within the bones of your face. They are located beneath your eyes, in the middle of your forehead, and above your eyes. In healthy paranasal sinuses, mucus is able to drain out, and air is able to circulate through them by way of your nose. However, when your paranasal sinuses are inflamed, mucus and air can become trapped. This can allow bacteria and other germs to grow and cause infection. Sinusitis can develop quickly and last only a short time (acute) or continue over a long period (chronic). Sinusitis that lasts for more than 12 weeks is considered chronic. CAUSES Causes of sinusitis include:  Allergies.  Structural abnormalities, such as displacement of the cartilage that separates your nostrils (deviated septum), which can decrease the air flow through your nose and sinuses and affect sinus drainage.  Functional abnormalities, such as when the small hairs (cilia) that line your sinuses and help remove mucus do not work properly or are not present. SIGNS AND SYMPTOMS Symptoms of acute and chronic sinusitis are the same. The primary symptoms are pain and pressure around the affected sinuses. Other symptoms include:  Upper toothache.  Earache.  Headache.  Bad breath.  Decreased sense of smell and taste.  A cough, which worsens when you are lying flat.  Fatigue.  Fever.  Thick drainage from your nose, which often is green and may contain pus (purulent).  Swelling and warmth over the affected sinuses. DIAGNOSIS Your health care provider will perform a physical exam. During your exam, your health care provider may perform any of the following to help determine if you have acute sinusitis or chronic sinusitis:  Look in your nose for  signs of abnormal growths in your nostrils (nasal polyps).  Tap over the affected sinus to check for signs of infection.  View the inside of your sinuses using an imaging device that has a light attached (endoscope). If your health care provider suspects that you have chronic sinusitis, one or more of the following tests may be recommended:  Allergy tests.  Nasal culture. A sample of mucus is taken from your nose, sent to a lab, and screened for bacteria.  Nasal cytology. A sample of mucus is taken from your nose and examined by your health care provider to determine if your sinusitis is related to an allergy. TREATMENT Most cases of acute sinusitis are related to a viral infection and will resolve on their own within 10 days. Sometimes, medicines are prescribed to help relieve symptoms of both acute and chronic sinusitis. These may include pain medicines, decongestants, nasal steroid sprays, or saline sprays. However, for sinusitis related to a bacterial infection, your health care provider will prescribe antibiotic medicines. These are medicines that will help kill the bacteria causing the infection. Rarely, sinusitis is caused by a fungal infection. In these cases, your health care provider will prescribe antifungal medicine. For some cases of chronic sinusitis, surgery is needed. Generally, these are cases in which sinusitis recurs more than 3 times per year, despite other treatments. HOME CARE INSTRUCTIONS  Drink plenty of water. Water helps thin the mucus so your sinuses can drain more easily.  Use a humidifier.  Inhale steam 3-4 times a day (for example, sit in the bathroom with the shower running).  Apply a warm, moist washcloth  to your face 3-4 times a day, or as directed by your health care provider.  Use saline nasal sprays to help moisten and clean your sinuses.  Take medicines only as directed by your health care provider.  If you were prescribed either an antibiotic or  antifungal medicine, finish it all even if you start to feel better. SEEK IMMEDIATE MEDICAL CARE IF:  You have increasing pain or severe headaches.  You have nausea, vomiting, or drowsiness.  You have swelling around your face.  You have vision problems.  You have a stiff neck.  You have difficulty breathing.   This information is not intended to replace advice given to you by your health care provider. Make sure you discuss any questions you have with your health care provider.   Document Released: 09/19/2005 Document Revised: 10/10/2014 Document Reviewed: 10/04/2011 Elsevier Interactive Patient Education [employer].

## 2015-09-05 NOTE — Progress Notes (Signed)
Pre visit review using our clinic review tool, if applicable. No additional management support is needed unless otherwise documented below in the visit note. 

## 2016-05-13 ENCOUNTER — Other Ambulatory Visit: Payer: Self-pay | Admitting: *Deleted

## 2016-05-13 MED ORDER — LOVASTATIN 40 MG PO TABS: ORAL_TABLET | ORAL | 1 refills | Status: DC

## 2016-05-13 NOTE — Telephone Encounter (Signed)
Pt called requesting refill for 90 day supply to be sent to [facility].   RF request for lovastatin LOV: 08/25/15 Next ov: None Last written: 08/04/15 #90 w/ 3RF  Rx sent. Pt advised and voiced understanding.

## 2016-05-19 ENCOUNTER — Ambulatory Visit: Payer: BLUE CROSS/BLUE SHIELD | Admitting: Family Medicine

## 2016-07-25 ENCOUNTER — Ambulatory Visit (INDEPENDENT_AMBULATORY_CARE_PROVIDER_SITE_OTHER): Payer: Managed Care, Other (non HMO)

## 2016-07-25 DIAGNOSIS — Z23 Encounter for immunization: Secondary | ICD-10-CM

## 2016-10-14 ENCOUNTER — Other Ambulatory Visit: Payer: Self-pay | Admitting: Family Medicine

## 2016-10-14 ENCOUNTER — Ambulatory Visit (HOSPITAL_BASED_OUTPATIENT_CLINIC_OR_DEPARTMENT_OTHER)
Admission: RE | Admit: 2016-10-14 | Discharge: 2016-10-14 | Disposition: A | Discharge status: Home / Self Care | Payer: Managed Care, Other (non HMO) | Source: Ambulatory Visit | Attending: Family Medicine | Admitting: Family Medicine

## 2016-10-14 DIAGNOSIS — R1084 Generalized abdominal pain: Secondary | ICD-10-CM | POA: Diagnosis present

## 2016-10-14 DIAGNOSIS — R0789 Other chest pain: Secondary | ICD-10-CM

## 2016-10-27 ENCOUNTER — Ambulatory Visit (INDEPENDENT_AMBULATORY_CARE_PROVIDER_SITE_OTHER): Payer: Managed Care, Other (non HMO)

## 2016-10-27 DIAGNOSIS — R0789 Other chest pain: Secondary | ICD-10-CM | POA: Diagnosis not present

## 2016-10-27 LAB — EXERCISE TOLERANCE TEST
Estimated workload: 10.1 METS
Exercise duration (min): 9 min
Exercise duration (sec): 0 s
MPHR: 160 {beats}/min
Peak HR: 153 {beats}/min
Percent HR: 95 %
RPE: 17
Rest HR: 65 {beats}/min

## 2017-10-20 ENCOUNTER — Telehealth: Payer: Self-pay

## 2017-10-20 NOTE — Telephone Encounter (Signed)
SENT REFERRAL TO SCHEDULING 

## 2017-11-09 NOTE — Progress Notes (Signed)
Cardiology Office Note   Date:  11/10/2017   ID:  Tanya Daniels, DOB [birth year], MRN [id number]  PCP:  Tanya Daniels  Cardiologist:   No primary care provider on file. Referring:  Tanya Daniels  Chief Complaint  Patient presents with  . Chest Pain      History of Present Illness: Tanya Daniels is a 62 y.o. female who was referred by Tanya Daniels for evaluation of an abnormal EKG.  The patient was thought to have ectopy on an EKG recently.  I reviewed this and did not see that.  She has had chest pain over the years.  She had a negative perfusion study in the past.  She had a negative POET (Plain Old Exercise Treadmill) in Jan of last year.   Feels some chest discomfort.  Feels like a punching in her chest.  It is sporadic.  Happening since around Christmas.  He might last for 1-2 hours at a time.  It might be 6 out of 10 in intensity.  It can go away sometimes with antacids.  She cannot bring it on with activity.  Is not sure whether it feels like her reflux.  There is no radiation to her jaw or to her arm.  It does seem to increase with stress.  She travels and goes through airports and does daily walking without bringing this on.  She also feels her heart skipping.  She feels some episodes of rapid heart rate and wonders if this could be her chronic that she is had previously.  Is not had any presyncope or syncope.  Is not had any new shortness of breath, PND or orthopnea.  Past Medical History:  Diagnosis Date  . Atypical chest pain 06/06/2012  . Depression with anxiety   . GERD (gastroesophageal reflux disease) 02/23/2012  . Hx of adenomatous colonic polyps 2002, 2009, 2015   TCS nl 2005 and 2009 (except oozing internal hemorrhoid tx'd w/ sclerotherapy 2009)) Dr Tanya Daniels.  Polypectomy x 1 2015  . Hyperlipidemia   . Palpitations 2008   holter neg  . Panic disorder without agoraphobia 09/29/2010    Past Surgical History:  Procedure Laterality Date  . ABDOMINAL  HYSTERECTOMY  1995   fibroids/ menorrhagia (ovaries remain)  . APPENDECTOMY  1975  . CARDIOVASCULAR STRESS TEST     exercise nuclear study 06/19/12 normal perfusion and normal function.  Marland Kitchen CESAREAN SECTION  1993  . COLONOSCOPY W/ POLYPECTOMY  multiple   Most recent 2015--polypectomy x 1.  Recall 5 yrs.     Current Outpatient Medications  Medication Sig Dispense Refill  . aspirin EC 81 MG tablet Take 81 mg by mouth 3 (three) times a week.     . clonazePAM (KLONOPIN) 1 MG tablet 1/2-1 tab po bid prn anxiety 180 tablet 1   No current facility-administered medications for this visit.     Allergies:   Amoxicillin and Erythromycin    Social History:  The patient  reports that  has never smoked. she has never used smokeless tobacco. She reports that she does not drink alcohol or use drugs.   Family History:  The patient's family history includes Cancer in her father; Coronary artery disease (age of onset: 53) in her father; Depression in her father; Diabetes in her father; Heart attack (age of onset: 29) in her brother; Hyperlipidemia in her brother.    ROS:  Please see the history of present illness.   Otherwise,  review of systems are positive for none.   All other systems are reviewed and negative.    PHYSICAL EXAM: VS:  BP 112/66   Pulse 64   Ht 5' 8.5" (1.74 m)   Wt 173 lb 12.8 oz (78.8 kg)   BMI 26.04 kg/m  , BMI Body mass index is 26.04 kg/m. GENERAL:  Well appearing HEENT:  Pupils equal round and reactive, fundi not visualized, oral mucosa unremarkable NECK:  No jugular venous distention, waveform within normal limits, carotid upstroke brisk and symmetric, no bruits, no thyromegaly LYMPHATICS:  No cervical, inguinal adenopathy LUNGS:  Clear to auscultation bilaterally BACK:  No CVA tenderness CHEST:  Unremarkable HEART:  PMI not displaced or sustained,S1 and S2 within normal limits, no S3, no S4, no clicks, no rubs, no murmurs ABD:  Flat, positive bowel sounds normal in  frequency in pitch, no bruits, no rebound, no guarding, no midline pulsatile mass, no hepatomegaly, no splenomegaly EXT:  2 plus pulses throughout, no edema, no cyanosis no clubbing SKIN:  No rashes no nodules NEURO:  Cranial nerves II through XII grossly intact, motor grossly intact throughout PSYCH:  Cognitively intact, oriented to person place and time    EKG:  EKG is ordered today. The ekg ordered today demonstrates sinus rhythm, rate 64, axis within normal limits, intervals within normal limits, no acute ST-T wave changes.   Recent Labs: No results found for requested labs within last 8760 hours.    Lipid Panel    Component Value Date/Time   CHOL 234 (H) 08/25/2015 0837   TRIG 91.0 08/25/2015 0837   HDL 64.00 08/25/2015 0837   CHOLHDL 4 08/25/2015 0837   VLDL 18.2 08/25/2015 0837   LDLCALC 151 (H) 08/25/2015 0837   LDLDIRECT 152.5 08/17/2012 0802      Wt Readings from Last 3 Encounters:  11/10/17 173 lb 12.8 oz (78.8 kg)  09/05/15 182 lb 4 oz (82.7 kg)  08/25/15 180 lb (81.6 kg)      Other studies Reviewed: Additional studies/ records that were reviewed today include: Labs, office records. Review of the above records demonstrates:  Please see elsewhere in the note.     ASSESSMENT AND PLAN:  ABNORMAL EKG: She was thought to have some ectopy on her EKG with occasional see.  She has some palpitations but at this point I Daniels not think these are particularly symptomatic and no further management is indicated except for the fact that she will get an Alive Cor device to try to check for the rhythm when she is having tachycardia.  In addition I will check a TSH.  CHEST PAIN: Her pain is atypical and she is had negative stress tests 3 times in the past.  She does have risk factors.  At this point I think would be prudent to proceed with a coronary calcium score.  This will allow me to evaluate a MESA score to help determine treatment of her dyslipidemia as  well.  DYSLIPIDEMIA: Her LDL was 177 with an HDL of 61.  For now she is not on treatment and I will await further scoring calculation as above.  She has a good diet and we talked further about this.   Current medicines are reviewed at length with the patient today.  The patient does not have concerns regarding medicines.  The following changes have been made:  no change  Labs/ tests ordered today include:   Orders Placed This Encounter  Procedures  . CT CARDIAC SCORING  .  EKG 12-Lead     Disposition:   FU with me as needed    Signed, Minus Breeding, MD  11/10/2017 1:06 PM    [facility]

## 2017-11-10 ENCOUNTER — Encounter: Payer: Self-pay | Admitting: Cardiology

## 2017-11-10 ENCOUNTER — Ambulatory Visit (INDEPENDENT_AMBULATORY_CARE_PROVIDER_SITE_OTHER): Payer: Managed Care, Other (non HMO) | Admitting: Cardiology

## 2017-11-10 VITALS — BP 112/66 | HR 64 | Ht 68.5 in | Wt 173.8 lb

## 2017-11-10 DIAGNOSIS — R002 Palpitations: Secondary | ICD-10-CM

## 2017-11-10 DIAGNOSIS — R9431 Abnormal electrocardiogram [ECG] [EKG]: Secondary | ICD-10-CM

## 2017-11-10 DIAGNOSIS — R079 Chest pain, unspecified: Secondary | ICD-10-CM

## 2017-11-10 NOTE — Patient Instructions (Signed)
Medication Instructions:  Continue current medication  If you need a refill on your cardiac medications before your next appointment, please call your pharmacy.  Labwork: None Ordered   Testing/Procedures: Your physician has requested that you have a Coronary Calcium Score Test. This test is done at our [facility].  Special Instructions: AliveCor  Follow-Up: Your physician wants you to follow-up in: As Needed.     Thank you for choosing [facility] at [facility]!!

## 2017-11-16 ENCOUNTER — Ambulatory Visit: Payer: Managed Care, Other (non HMO) | Admitting: Physician Assistant

## 2017-11-17 ENCOUNTER — Ambulatory Visit (INDEPENDENT_AMBULATORY_CARE_PROVIDER_SITE_OTHER)
Admission: RE | Admit: 2017-11-17 | Discharge: 2017-11-17 | Disposition: A | Discharge status: Home / Self Care | Payer: Self-pay | Source: Ambulatory Visit | Attending: Cardiology | Admitting: Cardiology

## 2017-11-17 DIAGNOSIS — R079 Chest pain, unspecified: Secondary | ICD-10-CM

## 2017-12-07 ENCOUNTER — Ambulatory Visit: Payer: Managed Care, Other (non HMO) | Admitting: Cardiology

## 2018-04-18 IMAGING — CT CT HEART SCORING
2 series · 16 of 20 positions shown, 18 images · non-contrast
Comparison: None.

CLINICAL DATA: Risk stratification

EXAM:
Coronary Calcium Score
TECHNIQUE: The patient was scanned on a Siemens Force scanner. Axial
non-contrast 3 mm slices were carried out through the heart. The
data set was analyzed on a dedicated work station and scored using
the Agatson method.

[Series 3: casc 3.0 i36f 2 bestdiast 71 % · axial · 0.31mm/px · z∈[+1256,+1349]mm · 8 of 41 slices shown, 10 images]
[im 5/41  vessel]
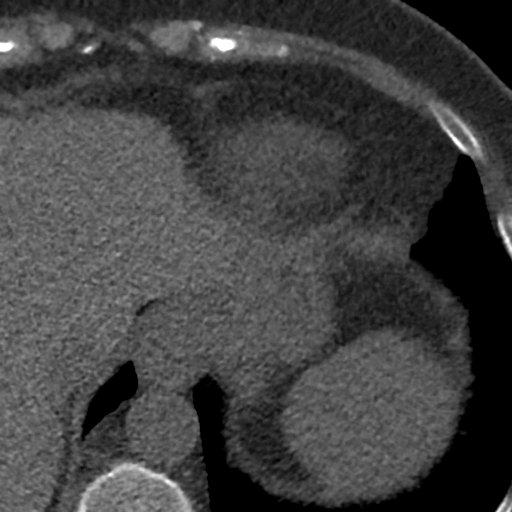
[im 5/41  lung]
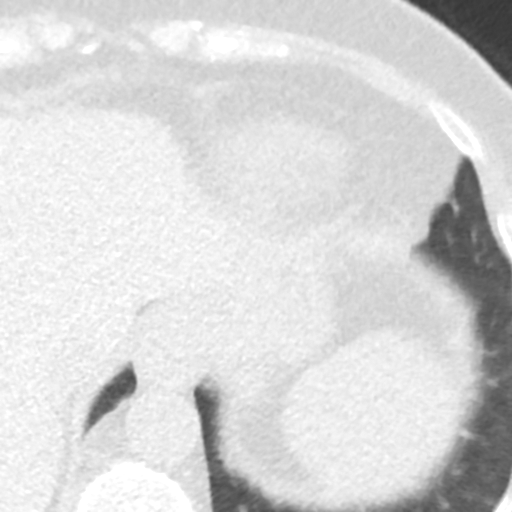
[im 9/41  vessel]
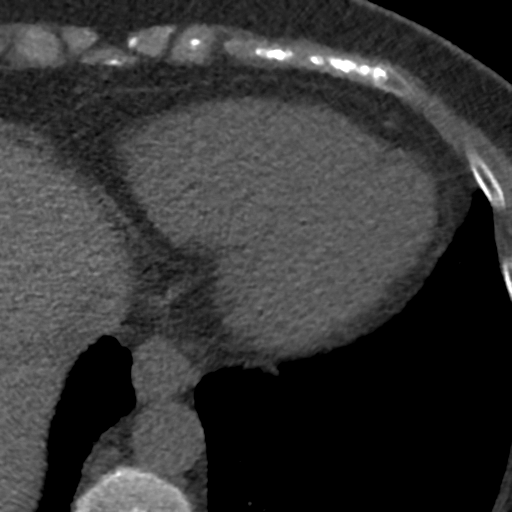
[im 14/41  vessel]
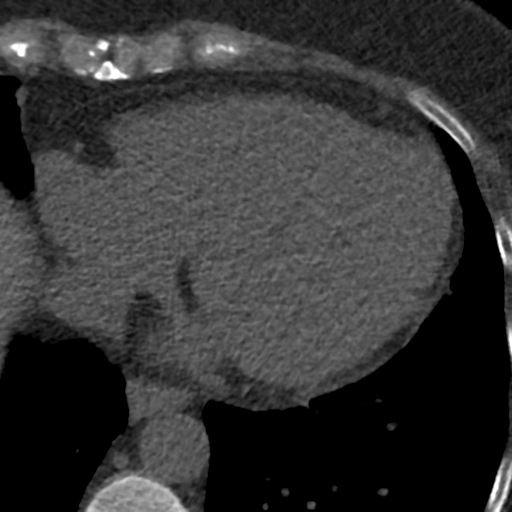
[im 18/41  vessel]
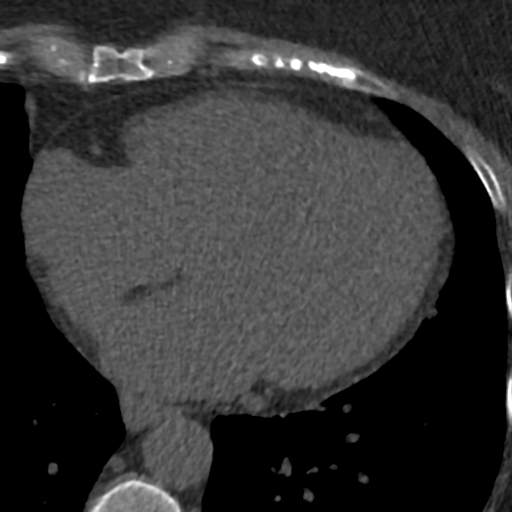
[im 23/41  vessel]
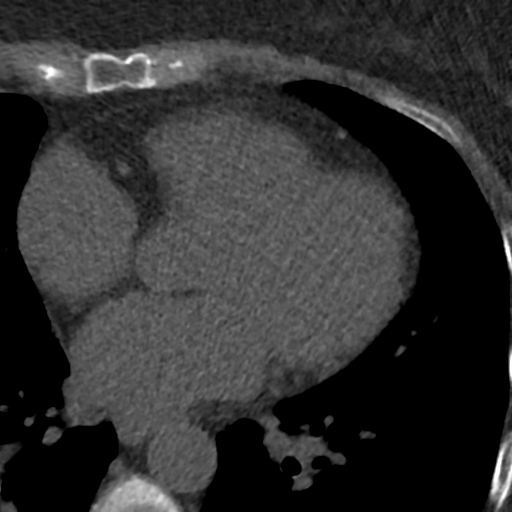
[im 23/41  lung]
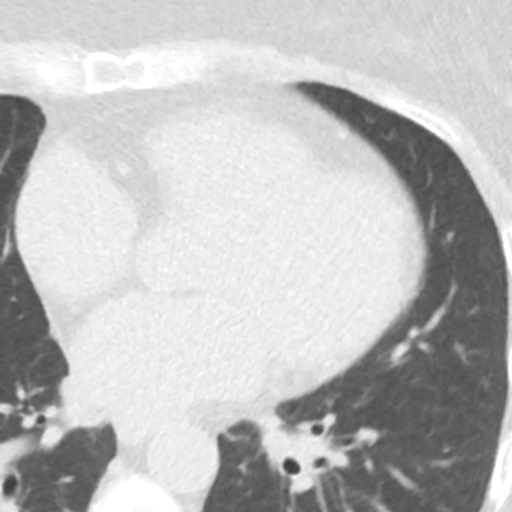
[im 27/41  vessel]
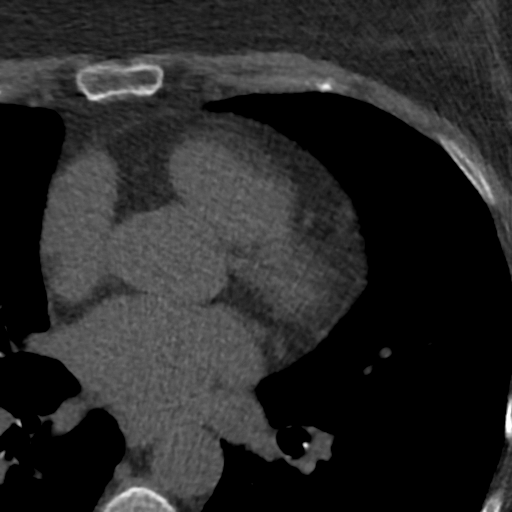
[im 32/41  vessel]
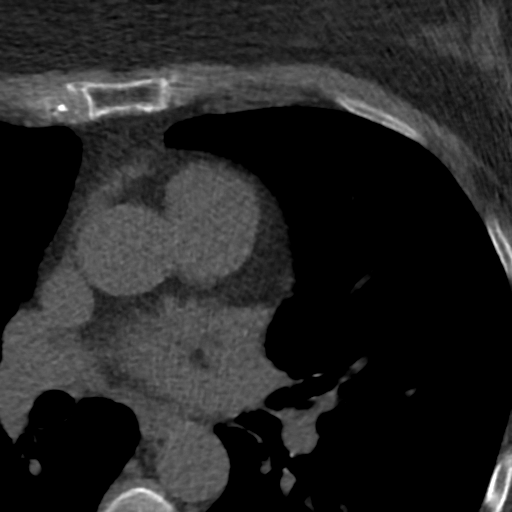
[im 36/41  vessel]
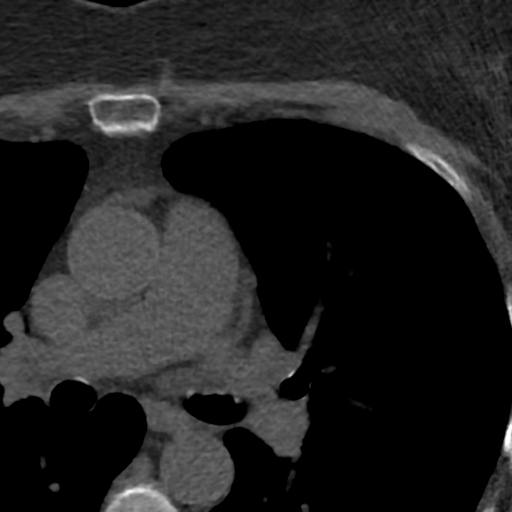

[Series 5: lung st 73 % · axial · 0.68mm/px · z∈[+1256,+1349]mm · 8 of 41 slices shown]
[im 5/41  lung]
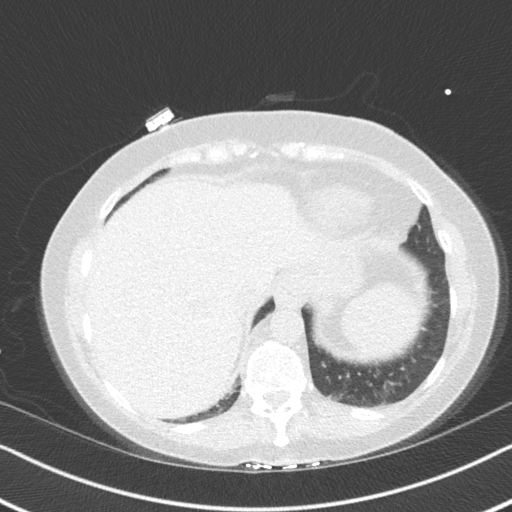
[im 9/41  lung]
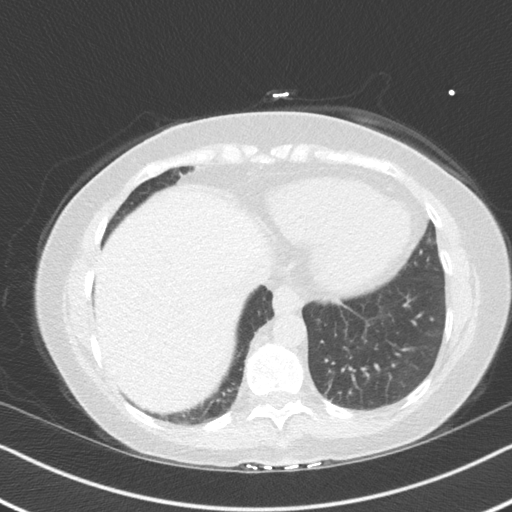
[im 14/41  lung]
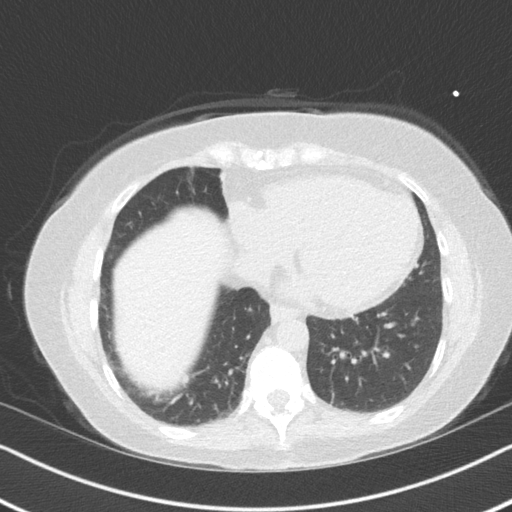
[im 18/41  lung]
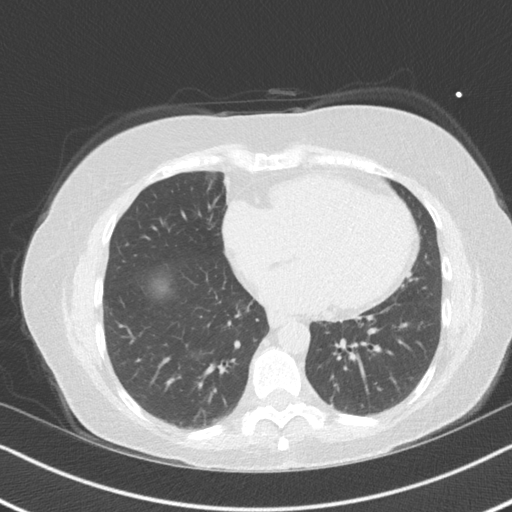
[im 23/41  lung]
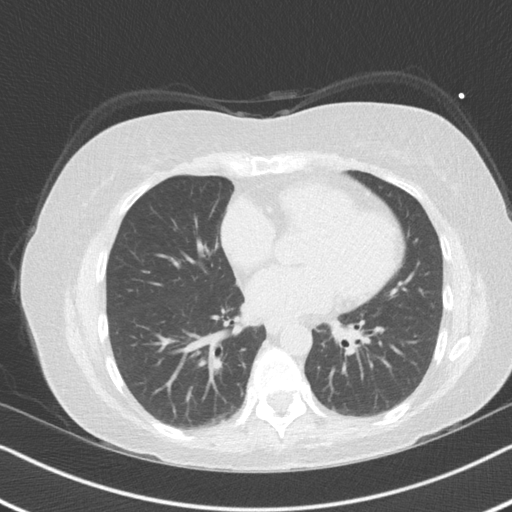
[im 27/41  lung]
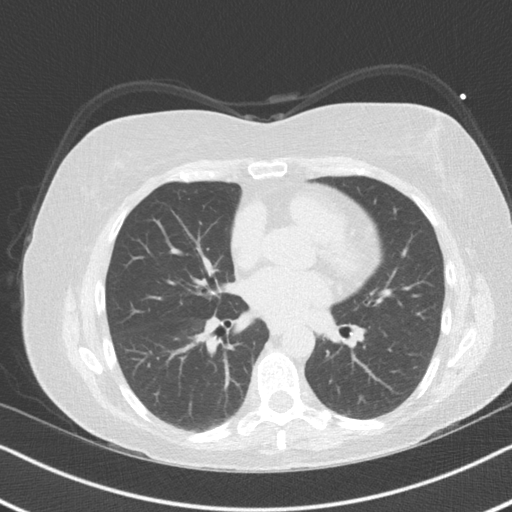
[im 32/41  lung]
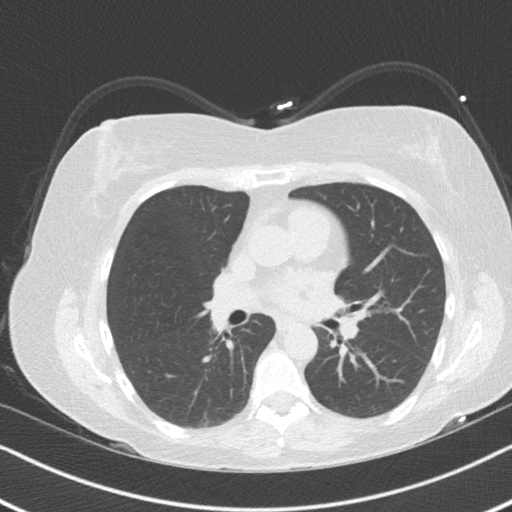
[im 36/41  lung]
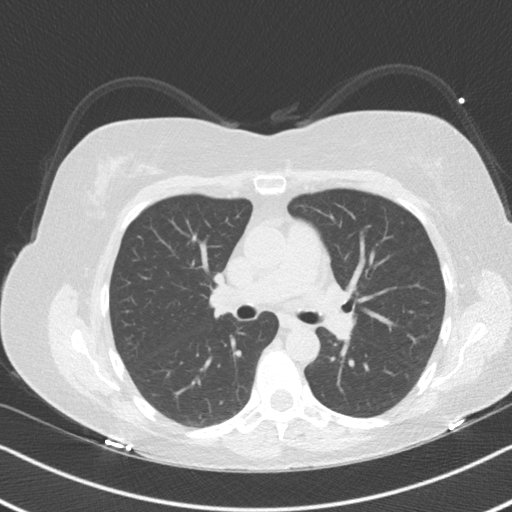

[16 of 20 positions shown; findings below may reference images not displayed]

FINDINGS: Non-cardiac: See separate report from [REDACTED].

Ascending Aorta: Normal size, no calcifications.

Pericardium: Normal.

Coronary arteries: Normal origin.
IMPRESSION: Coronary calcium score of 0. This was 0 percentile for age and sex
matched control.

EXAM:
OVER-READ INTERPRETATION  CT CHEST

The following report is an over-read performed by radiologist Dr.
Jordey Barbier [REDACTED] on 11/17/2017. This
over-read does not include interpretation of cardiac or coronary
anatomy or pathology. The coronary calcium score/coronary CTA
interpretation by the cardiologist is attached.
FINDINGS: Multiple tiny 2-4 mm pulmonary nodules scattered throughout the
lungs bilaterally, largest of which is in the posterior aspect of
the right lower lobe abutting the pleural surface (axial image 12 of
series 4). No larger more suspicious appearing pulmonary nodules or
masses are noted elsewhere in the visualized portions of the thorax.
Within the visualized portions of the thorax there is no acute
consolidative airspace disease, no pleural effusions, no
pneumothorax and no lymphadenopathy. Visualized portions of the
upper abdomen are unremarkable. There are no aggressive appearing
lytic or blastic lesions noted in the visualized portions of the
skeleton.
IMPRESSION: 1. Multiple tiny 2-4 mm pulmonary nodules scattered throughout the
lungs bilaterally, nonspecific but statistically likely benign. No
follow-up needed if patient is low-risk (and has no known or
suspected primary neoplasm). Non-contrast chest CT can be considered
in 12 months if patient is high-risk. This recommendation follows
the consensus statement: Guidelines for Management of Incidental
Pulmonary Nodules Detected on CT Images: From the [HOSPITAL]

## 2021-08-12 DIAGNOSIS — Z79899 Other long term (current) drug therapy: Secondary | ICD-10-CM | POA: Diagnosis not present

## 2021-08-12 DIAGNOSIS — E782 Mixed hyperlipidemia: Secondary | ICD-10-CM | POA: Diagnosis not present

## 2021-08-18 DIAGNOSIS — Z23 Encounter for immunization: Secondary | ICD-10-CM | POA: Diagnosis not present

## 2021-08-31 DIAGNOSIS — M9901 Segmental and somatic dysfunction of cervical region: Secondary | ICD-10-CM | POA: Diagnosis not present

## 2021-08-31 DIAGNOSIS — M47816 Spondylosis without myelopathy or radiculopathy, lumbar region: Secondary | ICD-10-CM | POA: Diagnosis not present

## 2021-08-31 DIAGNOSIS — M5412 Radiculopathy, cervical region: Secondary | ICD-10-CM | POA: Diagnosis not present

## 2021-08-31 DIAGNOSIS — M4724 Other spondylosis with radiculopathy, thoracic region: Secondary | ICD-10-CM | POA: Diagnosis not present

## 2021-08-31 DIAGNOSIS — M9903 Segmental and somatic dysfunction of lumbar region: Secondary | ICD-10-CM | POA: Diagnosis not present

## 2021-08-31 DIAGNOSIS — M9902 Segmental and somatic dysfunction of thoracic region: Secondary | ICD-10-CM | POA: Diagnosis not present

## 2021-09-01 DIAGNOSIS — M9902 Segmental and somatic dysfunction of thoracic region: Secondary | ICD-10-CM | POA: Diagnosis not present

## 2021-09-01 DIAGNOSIS — M4724 Other spondylosis with radiculopathy, thoracic region: Secondary | ICD-10-CM | POA: Diagnosis not present

## 2021-09-01 DIAGNOSIS — M9903 Segmental and somatic dysfunction of lumbar region: Secondary | ICD-10-CM | POA: Diagnosis not present

## 2021-09-01 DIAGNOSIS — M5412 Radiculopathy, cervical region: Secondary | ICD-10-CM | POA: Diagnosis not present

## 2021-09-01 DIAGNOSIS — M47816 Spondylosis without myelopathy or radiculopathy, lumbar region: Secondary | ICD-10-CM | POA: Diagnosis not present

## 2021-09-01 DIAGNOSIS — M9901 Segmental and somatic dysfunction of cervical region: Secondary | ICD-10-CM | POA: Diagnosis not present

## 2021-09-02 DIAGNOSIS — M9902 Segmental and somatic dysfunction of thoracic region: Secondary | ICD-10-CM | POA: Diagnosis not present

## 2021-09-02 DIAGNOSIS — M47816 Spondylosis without myelopathy or radiculopathy, lumbar region: Secondary | ICD-10-CM | POA: Diagnosis not present

## 2021-09-02 DIAGNOSIS — M9903 Segmental and somatic dysfunction of lumbar region: Secondary | ICD-10-CM | POA: Diagnosis not present

## 2021-09-02 DIAGNOSIS — M5412 Radiculopathy, cervical region: Secondary | ICD-10-CM | POA: Diagnosis not present

## 2021-09-02 DIAGNOSIS — M4724 Other spondylosis with radiculopathy, thoracic region: Secondary | ICD-10-CM | POA: Diagnosis not present

## 2021-09-02 DIAGNOSIS — M9901 Segmental and somatic dysfunction of cervical region: Secondary | ICD-10-CM | POA: Diagnosis not present

## 2021-09-06 DIAGNOSIS — M47816 Spondylosis without myelopathy or radiculopathy, lumbar region: Secondary | ICD-10-CM | POA: Diagnosis not present

## 2021-09-06 DIAGNOSIS — M9902 Segmental and somatic dysfunction of thoracic region: Secondary | ICD-10-CM | POA: Diagnosis not present

## 2021-09-06 DIAGNOSIS — M5412 Radiculopathy, cervical region: Secondary | ICD-10-CM | POA: Diagnosis not present

## 2021-09-06 DIAGNOSIS — M9903 Segmental and somatic dysfunction of lumbar region: Secondary | ICD-10-CM | POA: Diagnosis not present

## 2021-09-06 DIAGNOSIS — M9901 Segmental and somatic dysfunction of cervical region: Secondary | ICD-10-CM | POA: Diagnosis not present

## 2021-09-06 DIAGNOSIS — M4724 Other spondylosis with radiculopathy, thoracic region: Secondary | ICD-10-CM | POA: Diagnosis not present

## 2021-09-08 DIAGNOSIS — M4724 Other spondylosis with radiculopathy, thoracic region: Secondary | ICD-10-CM | POA: Diagnosis not present

## 2021-09-08 DIAGNOSIS — M5412 Radiculopathy, cervical region: Secondary | ICD-10-CM | POA: Diagnosis not present

## 2021-09-08 DIAGNOSIS — M47816 Spondylosis without myelopathy or radiculopathy, lumbar region: Secondary | ICD-10-CM | POA: Diagnosis not present

## 2021-09-08 DIAGNOSIS — M9902 Segmental and somatic dysfunction of thoracic region: Secondary | ICD-10-CM | POA: Diagnosis not present

## 2021-09-08 DIAGNOSIS — M9901 Segmental and somatic dysfunction of cervical region: Secondary | ICD-10-CM | POA: Diagnosis not present

## 2021-09-08 DIAGNOSIS — M9903 Segmental and somatic dysfunction of lumbar region: Secondary | ICD-10-CM | POA: Diagnosis not present

## 2021-09-13 DIAGNOSIS — M5412 Radiculopathy, cervical region: Secondary | ICD-10-CM | POA: Diagnosis not present

## 2021-09-13 DIAGNOSIS — M4724 Other spondylosis with radiculopathy, thoracic region: Secondary | ICD-10-CM | POA: Diagnosis not present

## 2021-09-13 DIAGNOSIS — M9903 Segmental and somatic dysfunction of lumbar region: Secondary | ICD-10-CM | POA: Diagnosis not present

## 2021-09-13 DIAGNOSIS — M9901 Segmental and somatic dysfunction of cervical region: Secondary | ICD-10-CM | POA: Diagnosis not present

## 2021-09-13 DIAGNOSIS — M9902 Segmental and somatic dysfunction of thoracic region: Secondary | ICD-10-CM | POA: Diagnosis not present

## 2021-09-13 DIAGNOSIS — M47816 Spondylosis without myelopathy or radiculopathy, lumbar region: Secondary | ICD-10-CM | POA: Diagnosis not present

## 2021-09-14 DIAGNOSIS — M9901 Segmental and somatic dysfunction of cervical region: Secondary | ICD-10-CM | POA: Diagnosis not present

## 2021-09-14 DIAGNOSIS — M47816 Spondylosis without myelopathy or radiculopathy, lumbar region: Secondary | ICD-10-CM | POA: Diagnosis not present

## 2021-09-14 DIAGNOSIS — M5412 Radiculopathy, cervical region: Secondary | ICD-10-CM | POA: Diagnosis not present

## 2021-09-14 DIAGNOSIS — M9903 Segmental and somatic dysfunction of lumbar region: Secondary | ICD-10-CM | POA: Diagnosis not present

## 2021-09-14 DIAGNOSIS — M9902 Segmental and somatic dysfunction of thoracic region: Secondary | ICD-10-CM | POA: Diagnosis not present

## 2021-09-14 DIAGNOSIS — M4724 Other spondylosis with radiculopathy, thoracic region: Secondary | ICD-10-CM | POA: Diagnosis not present

## 2021-09-16 DIAGNOSIS — M47816 Spondylosis without myelopathy or radiculopathy, lumbar region: Secondary | ICD-10-CM | POA: Diagnosis not present

## 2021-09-16 DIAGNOSIS — M9903 Segmental and somatic dysfunction of lumbar region: Secondary | ICD-10-CM | POA: Diagnosis not present

## 2021-09-16 DIAGNOSIS — M9901 Segmental and somatic dysfunction of cervical region: Secondary | ICD-10-CM | POA: Diagnosis not present

## 2021-09-16 DIAGNOSIS — M4724 Other spondylosis with radiculopathy, thoracic region: Secondary | ICD-10-CM | POA: Diagnosis not present

## 2021-09-16 DIAGNOSIS — M9902 Segmental and somatic dysfunction of thoracic region: Secondary | ICD-10-CM | POA: Diagnosis not present

## 2021-09-16 DIAGNOSIS — M5412 Radiculopathy, cervical region: Secondary | ICD-10-CM | POA: Diagnosis not present

## 2022-08-11 ENCOUNTER — Other Ambulatory Visit (HOSPITAL_BASED_OUTPATIENT_CLINIC_OR_DEPARTMENT_OTHER): Payer: Self-pay

## 2022-08-11 MED ORDER — FLUAD QUADRIVALENT 0.5 ML IM PRSY
PREFILLED_SYRINGE | INTRAMUSCULAR | 0 refills | Status: AC
  Filled 2022-08-11: qty 0.5, 1d supply, fill #0

## 2024-02-02 ENCOUNTER — Encounter (HOSPITAL_COMMUNITY): Payer: Self-pay

## 2024-02-02 ENCOUNTER — Emergency Department (HOSPITAL_COMMUNITY)
Admission: EM | Admit: 2024-02-02 | Discharge: 2024-02-03 | Disposition: A | Discharge status: Home / Self Care | Attending: Emergency Medicine | Admitting: Emergency Medicine

## 2024-02-02 DIAGNOSIS — E860 Dehydration: Secondary | ICD-10-CM

## 2024-02-02 DIAGNOSIS — R112 Nausea with vomiting, unspecified: Secondary | ICD-10-CM | POA: Diagnosis present

## 2024-02-02 DIAGNOSIS — Z85038 Personal history of other malignant neoplasm of large intestine: Secondary | ICD-10-CM | POA: Diagnosis not present

## 2024-02-02 DIAGNOSIS — Z7982 Long term (current) use of aspirin: Secondary | ICD-10-CM | POA: Insufficient documentation

## 2024-02-02 DIAGNOSIS — T383X5A Adverse effect of insulin and oral hypoglycemic [antidiabetic] drugs, initial encounter: Secondary | ICD-10-CM | POA: Insufficient documentation

## 2024-02-02 DIAGNOSIS — N390 Urinary tract infection, site not specified: Secondary | ICD-10-CM | POA: Diagnosis not present

## 2024-02-02 DIAGNOSIS — T887XXA Unspecified adverse effect of drug or medicament, initial encounter: Secondary | ICD-10-CM

## 2024-02-02 LAB — COMPREHENSIVE METABOLIC PANEL WITH GFR
ALT: 22 U/L (ref 0–44)
AST: 28 U/L (ref 15–41)
Albumin: 4 g/dL (ref 3.5–5.0)
Alkaline Phosphatase: 75 U/L (ref 38–126)
Anion gap: 13 (ref 5–15)
BUN: 12 mg/dL (ref 8–23)
CO2: 18 mmol/L — ABNORMAL LOW (ref 22–32)
Calcium: 9.9 mg/dL (ref 8.9–10.3)
Chloride: 108 mmol/L (ref 98–111)
Creatinine, Ser: 0.89 mg/dL (ref 0.44–1.00)
GFR, Estimated: >60 mL/min (ref >60)
Glucose, Bld: 94 mg/dL (ref 70–99)
Potassium: 3.3 mmol/L — ABNORMAL LOW (ref 3.5–5.1)
Sodium: 139 mmol/L (ref 135–145)
Total Bilirubin: 1.1 mg/dL (ref 0.0–1.2)
Total Protein: 7 g/dL (ref 6.5–8.1)

## 2024-02-02 LAB — I-STAT VENOUS BLOOD GAS, ED
Acid-base deficit: 4 mmol/L — ABNORMAL HIGH (ref 0.0–2.0)
Bicarbonate: 17.5 mmol/L — ABNORMAL LOW (ref 20.0–28.0)
Calcium, Ion: 1.15 mmol/L (ref 1.15–1.40)
HCT: 42 % (ref 36.0–46.0)
Hemoglobin: 14.3 g/dL (ref 12.0–15.0)
O2 Saturation: 73 %
Potassium: 3.2 mmol/L — ABNORMAL LOW (ref 3.5–5.1)
Sodium: 141 mmol/L (ref 135–145)
TCO2: 18 mmol/L — ABNORMAL LOW (ref 22–32)
pCO2, Ven: 23.4 mmHg — ABNORMAL LOW (ref 44–60)
pH, Ven: 7.482 — ABNORMAL HIGH (ref 7.25–7.43)
pO2, Ven: 34 mmHg (ref 32–45)

## 2024-02-02 LAB — URINALYSIS, ROUTINE W REFLEX MICROSCOPIC
Bacteria, UA: NONE SEEN
Bilirubin Urine: NEGATIVE
Glucose, UA: NEGATIVE mg/dL
Hgb urine dipstick: NEGATIVE
Ketones, ur: 80 mg/dL — AB
Nitrite: NEGATIVE
Protein, ur: 30 mg/dL — AB
Specific Gravity, Urine: 1.025 (ref 1.005–1.030)
pH: 6 (ref 5.0–8.0)

## 2024-02-02 LAB — CBC
HCT: 43.3 % (ref 36.0–46.0)
Hemoglobin: 14.6 g/dL (ref 12.0–15.0)
MCH: 28.2 pg (ref 26.0–34.0)
MCHC: 33.7 g/dL (ref 30.0–36.0)
MCV: 83.6 fL (ref 80.0–100.0)
Platelets: 223 10*3/uL (ref 150–400)
RBC: 5.18 MIL/uL — ABNORMAL HIGH (ref 3.87–5.11)
RDW: 12.6 % (ref 11.5–15.5)
WBC: 8.8 10*3/uL (ref 4.0–10.5)
nRBC: 0 % (ref 0.0–0.2)

## 2024-02-02 LAB — LIPASE, BLOOD: Lipase: 25 U/L (ref 11–51)

## 2024-02-02 LAB — BETA-HYDROXYBUTYRIC ACID: Beta-Hydroxybutyric Acid: 1.05 mmol/L — ABNORMAL HIGH (ref 0.05–0.27)

## 2024-02-02 MED ORDER — METOCLOPRAMIDE HCL 5 MG/ML IJ SOLN
10.0000 mg | Freq: Once | INTRAMUSCULAR | Status: AC
Start: 2024-02-02 — End: 2024-02-02
  Administered 2024-02-02: 10 mg via INTRAVENOUS
  Filled 2024-02-02: qty 2

## 2024-02-02 MED ORDER — DIPHENHYDRAMINE HCL 50 MG/ML IJ SOLN
25.0000 mg | Freq: Once | INTRAMUSCULAR | Status: AC
  Administered 2024-02-02: 25 mg via INTRAVENOUS
  Filled 2024-02-02: qty 1

## 2024-02-02 MED ORDER — ONDANSETRON HCL 4 MG/2ML IJ SOLN
4.0000 mg | Freq: Once | INTRAMUSCULAR | Status: AC
Start: 2024-02-02 — End: 2024-02-02
  Administered 2024-02-02: 4 mg via INTRAVENOUS
  Filled 2024-02-02: qty 2

## 2024-02-02 MED ORDER — SODIUM CHLORIDE 0.9 % IV SOLN
1.0000 g | Freq: Once | INTRAVENOUS | Status: AC
  Administered 2024-02-03: 1 g via INTRAVENOUS
  Filled 2024-02-02: qty 10

## 2024-02-02 MED ORDER — LACTATED RINGERS IV BOLUS
1000.0000 mL | Freq: Once | INTRAVENOUS | Status: AC
  Administered 2024-02-02: 1000 mL via INTRAVENOUS

## 2024-02-02 NOTE — ED Provider Notes (Signed)
 [place] [facility] DEPARTMENT AT [facility] Provider Note   CSN: [id number] Arrival date & time: 02/02/24  1530     History {Add pertinent medical, surgical, social history, OB history to HPI:1} Chief Complaint  Patient presents with   Emesis   Altered Mental Status    Tanya Daniels is a 68 y.o. adult.  HPI     68 year old with history of generalized anxiety disorder, hyperlipidemia, tremor, benign neoplasm of colon, panic disorder   They started taking a GLP-1 compound injection of 2.5 mg yesterday, and 2 hours after that began having nausea and vomiting.  Past Medical History:  Diagnosis Date   Atypical chest pain 06/06/2012   Depression with anxiety    GERD (gastroesophageal reflux disease) 02/23/2012   Hx of adenomatous colonic polyps 2002, 2009, 2015   TCS nl 2005 and 2009 (except oozing internal hemorrhoid tx'd w/ sclerotherapy 2009)) Dr Andriette Keeling.  Polypectomy x 1 2015   Hyperlipidemia    Palpitations 2008   holter neg   Panic disorder without agoraphobia 09/29/2010     Home Medications Prior to Admission medications   Medication Sig Start Date End Date Taking? Authorizing Provider  aspirin EC 81 MG tablet Take 81 mg by mouth 3 (three) times a week.     [provider]  clonazePAM  (KLONOPIN ) 1 MG tablet 1/2-1 tab po bid prn anxiety 08/04/15   McGowen, Minetta Aly, MD  influenza vaccine adjuvanted (FLUAD QUADRIVALENT ) 0.5 ML injection Inject into the muscle. 08/11/22   Liane Redman, MD      Allergies    Amoxicillin  and Erythromycin    Review of Systems   Review of Systems  Physical Exam Updated Vital Signs BP 117/61 (BP Location: Right Arm)   Pulse 90   Temp (!) 97.3 F (36.3 C) (Oral)   Resp 18   SpO2 100%  Physical Exam  ED Results / Procedures / Treatments   Labs (all labs ordered are listed, but only abnormal results are displayed) Labs Reviewed  COMPREHENSIVE METABOLIC PANEL WITH GFR - Abnormal; Notable for the following  components:      Result Value   Potassium 3.3 (*)    CO2 18 (*)    All other components within normal limits  CBC - Abnormal; Notable for the following components:   RBC 5.18 (*)    All other components within normal limits  BETA-HYDROXYBUTYRIC ACID - Abnormal; Notable for the following components:   Beta-Hydroxybutyric Acid 1.05 (*)    All other components within normal limits  I-STAT VENOUS BLOOD GAS, ED - Abnormal; Notable for the following components:   pH, Ven 7.482 (*)    pCO2, Ven 23.4 (*)    Bicarbonate 17.5 (*)    TCO2 18 (*)    Acid-base deficit 4.0 (*)    Potassium 3.2 (*)    All other components within normal limits  LIPASE, BLOOD  URINALYSIS, ROUTINE W REFLEX MICROSCOPIC    EKG None  Radiology No results found.  Procedures Procedures  {Document cardiac monitor, telemetry assessment procedure when appropriate:1}  Medications Ordered in ED Medications  ondansetron  (ZOFRAN ) injection 4 mg (has no administration in time range)    ED Course/ Medical Decision Making/ A&P   {   Click here for ABCD2, HEART and other calculatorsREFRESH Note before signing :1}                              Medical  Decision Making Amount and/or Complexity of Data Reviewed Labs: ordered.   ***  {Document critical care time when appropriate:1} {Document review of labs and clinical decision tools ie heart score, Chads2Vasc2 etc:1}  {Document your independent review of radiology images, and any outside records:1} {Document your discussion with family members, caretakers, and with consultants:1} {Document social determinants of health affecting pt's care:1} {Document your decision making why or why not admission, treatments were needed:1} Final Clinical Impression(s) / ED Diagnoses Final diagnoses:  None    Rx / DC Orders ED Discharge Orders     None

## 2024-02-02 NOTE — ED Triage Notes (Signed)
 Per EMS, Pt, from home, c/o n/v starting 2hrs after taking a GLP-1 compound injection 2.5mg  yesterday.  Denies pain.  Pt reports a couple episodes of confusion today.  Pt is currently A&Ox4.    Pt given 4mg  Zofran  and 500mL NS en route.   Pt reports facial tremor is normal.  Sts it is an essential tremor.

## 2024-02-02 NOTE — ED Provider Triage Note (Signed)
 Emergency Medicine Provider Triage Evaluation Note  Tanya Daniels , a 68 y.o. adult  was evaluated in triage.  Pt complains of abdominal pain nausea vomiting after starting GLP-1.  Review of Systems  Positive: Nausea vomiting Negative: Chest Pain  Physical Exam  BP 117/61 (BP Location: Right Arm)   Pulse 90   Temp (!) 97.3 F (36.3 C) (Oral)   Resp 18   SpO2 100%  Gen:   Awake, no distress   Resp:  Normal effort  MSK:   Moves extremities without difficulty  Other:    Medical Decision Making  Medically screening exam initiated at 4:00 PM.  Appropriate orders placed.  LOLENE SHAMS was informed that the remainder of the evaluation will be completed by another provider, this initial triage assessment does not replace that evaluation, and the importance of remaining in the ED until their evaluation is complete.     Lowery Rue, DO 02/02/24 1600

## 2024-02-03 MED ORDER — ONDANSETRON 4 MG PO TBDP: 4.0000 mg | ORAL_TABLET | Freq: Three times a day (TID) | ORAL | 0 refills | Status: AC | PRN

## 2024-02-03 MED ORDER — METOCLOPRAMIDE HCL 10 MG PO TABS: 10.0000 mg | ORAL_TABLET | Freq: Four times a day (QID) | ORAL | 0 refills | Status: AC

## 2024-02-03 MED ORDER — CEPHALEXIN 500 MG PO CAPS: 500.0000 mg | ORAL_CAPSULE | Freq: Three times a day (TID) | ORAL | 0 refills | Status: AC

## 2024-02-04 LAB — URINE CULTURE
